# Patient Record
Sex: Male | Born: 1975 | Race: Black or African American | Hispanic: No | Marital: Single | Smoking: Never smoker
Health system: Southern US, Community
[De-identification: ages and names within clinical notes are randomized; demographics above are authoritative.]

## PROBLEM LIST (undated history)

## (undated) DIAGNOSIS — J45909 Unspecified asthma, uncomplicated: Secondary | ICD-10-CM

## (undated) DIAGNOSIS — M2010 Hallux valgus (acquired), unspecified foot: Secondary | ICD-10-CM

---

## 2011-10-14 ENCOUNTER — Encounter (HOSPITAL_COMMUNITY): Payer: Self-pay | Admitting: Emergency Medicine

## 2011-10-14 ENCOUNTER — Emergency Department (HOSPITAL_COMMUNITY)
Admission: EM | Admit: 2011-10-14 | Discharge: 2011-10-14 | Disposition: A | Discharge status: Home / Self Care | Payer: Self-pay | Attending: Emergency Medicine | Admitting: Emergency Medicine

## 2011-10-14 ENCOUNTER — Emergency Department (HOSPITAL_COMMUNITY): Payer: Self-pay

## 2011-10-14 DIAGNOSIS — M25539 Pain in unspecified wrist: Secondary | ICD-10-CM | POA: Insufficient documentation

## 2011-10-14 DIAGNOSIS — G8929 Other chronic pain: Secondary | ICD-10-CM | POA: Insufficient documentation

## 2011-10-14 MED ORDER — IBUPROFEN 800 MG PO TABS: 800.0000 mg | ORAL_TABLET | Freq: Three times a day (TID) | ORAL | Status: AC

## 2011-10-14 MED ORDER — IBUPROFEN 400 MG PO TABS
800.0000 mg | ORAL_TABLET | Freq: Once | ORAL | Status: AC
  Administered 2011-10-14: 800 mg via ORAL
  Filled 2011-10-14: qty 2

## 2011-10-14 NOTE — ED Notes (Signed)
Pt states his right wrist has been hurting for the last two days. States had a cyst in the same area that burst approx  1 month ago. Also c/o right arm numbness with he turns his head.

## 2011-10-14 NOTE — Progress Notes (Signed)
Orthopedic Tech Progress Note Patient Details:  Frederick Lyons [birth year] [id number]      Cammer, Mickie Bail 10/14/2011, 5:08 PM

## 2011-10-14 NOTE — ED Provider Notes (Signed)
History     CSN: [id number]  Arrival date & time 10/14/11  1242   None     Chief Complaint  Patient presents with  . Wrist Pain    (Consider location/radiation/quality/duration/timing/severity/associated sxs/prior treatment) Patient is a 36 y.o. male presenting with wrist pain. The history is provided by the patient. No language interpreter was used.  Wrist Pain This is a chronic problem. The current episode started 1 to 4 weeks ago. The problem occurs daily. The problem has been gradually worsening. Pertinent negatives include no fever, joint swelling, nausea, vomiting or weakness. The symptoms are aggravated by twisting and bending. He has tried nothing for the symptoms.   Pt c/o r wrist pain x > 4 weeks.  States that he had a cyst on his R wrist 2 months ago but hit it playing basketball and the cyst is gone now.  Denies injury.  +cms to wrist and hand.  No swelling or defomity.   History reviewed. No pertinent past medical history.  History reviewed. No pertinent past surgical history.  History reviewed. No pertinent family history.  History  Substance Use Topics  . Smoking status: Not on file  . Smokeless tobacco: Not on file  . Alcohol Use: Not on file      Review of Systems  Constitutional: Negative.  Negative for fever.  HENT: Negative.   Eyes: Negative.   Respiratory: Negative.   Cardiovascular: Negative.   Gastrointestinal: Negative.  Negative for nausea and vomiting.  Musculoskeletal: Negative for joint swelling.       R wrist pain.  Neurological: Negative.  Negative for weakness.  Psychiatric/Behavioral: Negative.   All other systems reviewed and are negative.    Allergies  Review of patient's allergies indicates no known allergies.  Home Medications  No current outpatient prescriptions on file.  BP 104/74  Pulse 64  Temp 98.1 F (36.7 C) (Oral)  Resp 18  Wt 180 lb (81.647 kg)  SpO2 97%  Physical Exam  Nursing note and vitals  reviewed. Constitutional: He is oriented to person, place, and time. He appears well-developed and well-nourished.  HENT:  Head: Normocephalic.  Eyes: Conjunctivae and EOM are normal. Pupils are equal, round, and reactive to light.  Neck: Normal range of motion. Neck supple.  Cardiovascular: Normal rate.   Pulmonary/Chest: Effort normal.  Abdominal: Soft.  Musculoskeletal: Normal range of motion. He exhibits tenderness. He exhibits no edema.       R wrist pain   Neurological: He is alert and oriented to person, place, and time.  Skin: Skin is warm and dry.  Psychiatric: He has a normal mood and affect.    ED Course  Procedures (including critical care time)  Labs Reviewed - No data to display No results found.   No diagnosis found.    MDM   Right wrist pain x2 months. No acute findings on x-ray. Ibuprofen ice and elevate. Followup with or throws not better.        Remi Haggard, NP 10/14/11 1720

## 2011-10-14 NOTE — ED Notes (Signed)
Had a cyst on rt wrist and it went  Away  X several months ago and now for the past 2 weeks it has hurt and when he turns his neck to the rt his arm goes numb that also has been going on x 2 months

## 2011-10-15 NOTE — ED Provider Notes (Signed)
Medical screening examination/treatment/procedure(s) were performed by non-physician practitioner and as supervising physician I was immediately available for consultation/collaboration.  Gretel Cantu T Aurelius Gildersleeve, MD 10/15/11 0711 

## 2013-05-23 ENCOUNTER — Encounter (HOSPITAL_COMMUNITY): Payer: Self-pay | Admitting: Emergency Medicine

## 2013-05-23 ENCOUNTER — Emergency Department (HOSPITAL_COMMUNITY): Payer: 59

## 2013-05-23 ENCOUNTER — Emergency Department (HOSPITAL_COMMUNITY)
Admission: EM | Admit: 2013-05-23 | Discharge: 2013-05-23 | Disposition: A | Discharge status: Home / Self Care | Payer: 59 | Attending: Emergency Medicine | Admitting: Emergency Medicine

## 2013-05-23 DIAGNOSIS — Y929 Unspecified place or not applicable: Secondary | ICD-10-CM | POA: Insufficient documentation

## 2013-05-23 DIAGNOSIS — M79641 Pain in right hand: Secondary | ICD-10-CM

## 2013-05-23 DIAGNOSIS — S62309A Unspecified fracture of unspecified metacarpal bone, initial encounter for closed fracture: Secondary | ICD-10-CM | POA: Insufficient documentation

## 2013-05-23 DIAGNOSIS — W2209XA Striking against other stationary object, initial encounter: Secondary | ICD-10-CM | POA: Insufficient documentation

## 2013-05-23 DIAGNOSIS — Y9389 Activity, other specified: Secondary | ICD-10-CM | POA: Insufficient documentation

## 2013-05-23 MED ORDER — LIDOCAINE HCL (PF) 1 % IJ SOLN
5.0000 mL | Freq: Once | INTRAMUSCULAR | Status: AC
  Administered 2013-05-23: 5 mL
  Filled 2013-05-23: qty 5

## 2013-05-23 MED ORDER — HYDROCODONE-ACETAMINOPHEN 5-325 MG PO TABS
2.0000 | ORAL_TABLET | Freq: Once | ORAL | Status: AC
  Administered 2013-05-23: 2 via ORAL
  Filled 2013-05-23: qty 2

## 2013-05-23 MED ORDER — HYDROCODONE-ACETAMINOPHEN 5-325 MG PO TABS: 1.0000 | ORAL_TABLET | Freq: Four times a day (QID) | ORAL | Status: DC | PRN
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
## 2013-05-23 NOTE — Progress Notes (Signed)
Orthopedic Tech Progress Note Patient Details:  Frederick PersonsBarry L Lyons 07/07/1975 [id number]  Ortho Devices Type of Ortho Device: Ulna gutter splint   [name], [name] 05/23/2013, 7:02 AM

## 2013-05-23 NOTE — Discharge Instructions (Signed)
You have a finger fracture of the left hand, and possible hand fracture on right side as well. Keep the splints prescribed. See the Orthopedic doctor as requested.   Cast or Splint Care Casts and splints support injured limbs and keep bones from moving while they heal. It is important to care for your cast or splint at home.  HOME CARE INSTRUCTIONS  Keep the cast or splint uncovered during the drying period. It can take 24 to 48 hours to dry if it is made of plaster. A fiberglass cast will dry in less than 1 hour.  Do not rest the cast on anything harder than a pillow for the first 24 hours.  Do not put weight on your injured limb or apply pressure to the cast until your health care provider gives you permission.  Keep the cast or splint dry. Wet casts or splints can lose their shape and may not support the limb as well. A wet cast that has lost its shape can also create harmful pressure on your skin when it dries. Also, wet skin can become infected.  Cover the cast or splint with a plastic bag when bathing or when out in the rain or snow. If the cast is on the trunk of the body, take sponge baths until the cast is removed.  If your cast does become wet, dry it with a towel or a blow dryer on the cool setting only.  Keep your cast or splint clean. Soiled casts may be wiped with a moistened cloth.  Do not place any hard or soft foreign objects under your cast or splint, such as cotton, toilet paper, lotion, or powder.  Do not try to scratch the skin under the cast with any object. The object could get stuck inside the cast. Also, scratching could lead to an infection. If itching is a problem, use a blow dryer on a cool setting to relieve discomfort.  Do not trim or cut your cast or remove padding from inside of it.  Exercise all joints next to the injury that are not immobilized by the cast or splint. For example, if you have a long leg cast, exercise the hip joint and toes. If you have  an arm cast or splint, exercise the shoulder, elbow, thumb, and fingers.  Elevate your injured arm or leg on 1 or 2 pillows for the first 1 to 3 days to decrease swelling and pain.It is best if you can comfortably elevate your cast so it is higher than your heart. SEEK MEDICAL CARE IF:   Your cast or splint cracks.  Your cast or splint is too tight or too loose.  You have unbearable itching inside the cast.  Your cast becomes wet or develops a soft spot or area.  You have a bad smell coming from inside your cast.  You get an object stuck under your cast.  Your skin around the cast becomes red or raw.  You have new pain or worsening pain after the cast has been applied. SEEK IMMEDIATE MEDICAL CARE IF:   You have fluid leaking through the cast.  You are unable to move your fingers or toes.  You have discolored (blue or white), cool, painful, or very swollen fingers or toes beyond the cast.  You have tingling or numbness around the injured area.  You have severe pain or pressure under the cast.  You have any difficulty with your breathing or have shortness of breath.  You have chest pain.  Document Released: 03/22/2000 Document Revised: 01/13/2013 Document Reviewed: 10/01/2012 [facility] Patient Information 2014 [place], [place].  Closed Reduction for Metacarpal Fracture or Dislocation Care After Refer to this sheet in the next few weeks. These instructions provide you with information on caring for yourself after your procedure. Your caregiver may also give you more specific instructions. Your treatment has been planned according to current medical practices, but problems sometimes occur. Call your caregiver if you have any problems or questions after your procedure. HOME CARE INSTRUCTIONS   Only take medicines as directed by your caregiver.  Keep the injured area elevated above the level of your heart while you are sleeping or sitting down.  Apply ice to the injured area,  twice per day, for 2 3 days:  Put ice in a plastic bag.  Place a towel between your skin and the bag.  Leave the ice on for 15 minutes.  Keep your splint clean and dry. Cover your splint with a bag while you shower. SEEK MEDICAL CARE IF:  Your pain does not get better or gets worse.  You develop numbness or tingling.  Your fingers turn white or bluish. MAKE SURE YOU:  Understand these instructions.  Will watch your condition.  Will get help right away if you are not doing well or get worse. Document Released: 12/18/2011 Document Revised: 07/20/2012 Document Reviewed: 12/18/2011 [facility] CenterExitCare Patient Information 2014 [place], [place].  Hand Fracture Your caregiver has diagnosed you with a fractured (broken) bone in your hand. If the bones are in good position and the hand is properly immobilized and rested, these injuries will usually heal in 3 to 6 weeks. A cast, splint, or bulky bandage is usually applied to keep the fracture site from moving. Do not remove the splint or cast until your caregiver approves. If the fracture is unstable or the bones are not aligned properly, surgery may be needed. Keep your hand raised (elevated) above the level of your heart as much as possible for the next 2 to 3 days until the swelling and pain are better. Apply ice packs for 15-20 minutes every 3 to 4 hours to help control the pain and swelling. See your caregiver or an orthopedic specialist as directed for follow-up care to make sure the fracture is beginning to heal properly. SEEK IMMEDIATE MEDICAL CARE IF:   You notice your fingers are cold, numb, crooked, or the pain of your injury is severe.  You are not improving or seem to be getting worse.  You have questions or concerns. Document Released: 05/02/2004 Document Revised: 06/17/2011 Document Reviewed: 09/20/2008 [facility]ExitCare Patient Information 2014 [place], [place].
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
## 2013-05-29 HISTORY — PX: OTHER SURGICAL HISTORY: SHX169

## 2013-06-18 ENCOUNTER — Encounter (HOSPITAL_BASED_OUTPATIENT_CLINIC_OR_DEPARTMENT_OTHER): Payer: Self-pay | Admitting: *Deleted

## 2013-06-18 NOTE — Progress Notes (Addendum)
NPO AFTER MN. ARRIVE AT 0745. NEEDS HG.  NOTED PT TIME CHANGE.  CALLED AND SPOKE TO PT MOTHER, WHOM IS BRINGING PT DOS, TO ARRIVE AT [phone] FOR SURGERY START AT 0800.

## 2013-06-24 ENCOUNTER — Ambulatory Visit (HOSPITAL_BASED_OUTPATIENT_CLINIC_OR_DEPARTMENT_OTHER)
Admission: RE | Admit: 2013-06-24 | Discharge: 2013-06-24 | Disposition: A | Discharge status: Home / Self Care | Payer: 59 | Source: Ambulatory Visit | Attending: Podiatry | Admitting: Podiatry

## 2013-06-24 ENCOUNTER — Encounter (HOSPITAL_BASED_OUTPATIENT_CLINIC_OR_DEPARTMENT_OTHER)
Admission: RE | Disposition: A | Discharge status: Home / Self Care | Payer: Self-pay | Source: Ambulatory Visit | Attending: Podiatry

## 2013-06-24 ENCOUNTER — Encounter (HOSPITAL_BASED_OUTPATIENT_CLINIC_OR_DEPARTMENT_OTHER): Payer: 59 | Admitting: Anesthesiology

## 2013-06-24 ENCOUNTER — Ambulatory Visit (HOSPITAL_BASED_OUTPATIENT_CLINIC_OR_DEPARTMENT_OTHER): Payer: 59 | Admitting: Anesthesiology

## 2013-06-24 ENCOUNTER — Encounter (HOSPITAL_BASED_OUTPATIENT_CLINIC_OR_DEPARTMENT_OTHER): Payer: Self-pay | Admitting: *Deleted

## 2013-06-24 DIAGNOSIS — M2010 Hallux valgus (acquired), unspecified foot: Secondary | ICD-10-CM | POA: Insufficient documentation

## 2013-06-24 DIAGNOSIS — M2060 Acquired deformities of toe(s), unspecified, unspecified foot: Secondary | ICD-10-CM | POA: Insufficient documentation

## 2013-06-24 HISTORY — DX: Hallux valgus (acquired), unspecified foot: M20.10

## 2013-06-24 HISTORY — PX: METATARSAL OSTEOTOMY: SHX1641

## 2013-06-24 HISTORY — PX: BUNIONECTOMY: SHX129

## 2013-06-24 LAB — POCT HEMOGLOBIN-HEMACUE: HEMOGLOBIN: 12.9 g/dL — AB (ref 13.0–17.0)

## 2013-06-24 SURGERY — BUNIONECTOMY
Anesthesia: Monitor Anesthesia Care | Site: Foot | Laterality: Right

## 2013-06-24 MED ORDER — FENTANYL CITRATE 0.05 MG/ML IJ SOLN
INTRAMUSCULAR | Status: AC
  Filled 2013-06-24: qty 2

## 2013-06-24 MED ORDER — MIDAZOLAM HCL 2 MG/2ML IJ SOLN
INTRAMUSCULAR | Status: AC
  Filled 2013-06-24: qty 2

## 2013-06-24 MED ORDER — FENTANYL CITRATE 0.05 MG/ML IJ SOLN
INTRAMUSCULAR | Status: AC
  Filled 2013-06-24: qty 4

## 2013-06-24 MED ORDER — ONDANSETRON HCL 4 MG/2ML IJ SOLN
INTRAMUSCULAR | Status: DC | PRN
  Administered 2013-06-24: 4 mg via INTRAVENOUS

## 2013-06-24 MED ORDER — MIDAZOLAM HCL 2 MG/2ML IJ SOLN
2.0000 mg | Freq: Once | INTRAMUSCULAR | Status: AC
  Administered 2013-06-24: 2 mg via INTRAVENOUS
  Filled 2013-06-24: qty 2

## 2013-06-24 MED ORDER — LACTATED RINGERS IV SOLN
INTRAVENOUS | Status: DC
Start: 2013-06-24 — End: 2013-06-24
  Administered 2013-06-24: 08:00:00 via INTRAVENOUS
  Filled 2013-06-24: qty 1000

## 2013-06-24 MED ORDER — PROPOFOL 10 MG/ML IV BOLUS
INTRAVENOUS | Status: DC | PRN
  Administered 2013-06-24: 180 mg via INTRAVENOUS

## 2013-06-24 MED ORDER — FENTANYL CITRATE 0.05 MG/ML IJ SOLN
25.0000 ug | INTRAMUSCULAR | Status: DC | PRN
  Filled 2013-06-24: qty 1

## 2013-06-24 MED ORDER — SODIUM CHLORIDE 0.9 % IJ SOLN
3.0000 mL | INTRAMUSCULAR | Status: DC | PRN
  Filled 2013-06-24: qty 3

## 2013-06-24 MED ORDER — OXYCODONE-ACETAMINOPHEN 5-325 MG PO TABS
1.0000 | ORAL_TABLET | ORAL | Status: DC | PRN
  Filled 2013-06-24: qty 2

## 2013-06-24 MED ORDER — FENTANYL CITRATE 0.05 MG/ML IJ SOLN
100.0000 ug | Freq: Once | INTRAMUSCULAR | Status: AC
  Administered 2013-06-24: 100 ug via INTRAVENOUS
  Filled 2013-06-24: qty 2

## 2013-06-24 MED ORDER — ROPIVACAINE HCL 5 MG/ML IJ SOLN
INTRAMUSCULAR | Status: DC | PRN
  Administered 2013-06-24: 30 mL via PERINEURAL

## 2013-06-24 MED ORDER — SODIUM CHLORIDE 0.9 % IJ SOLN
3.0000 mL | Freq: Two times a day (BID) | INTRAMUSCULAR | Status: DC
  Filled 2013-06-24: qty 3

## 2013-06-24 MED ORDER — SODIUM CHLORIDE 0.9 % IV SOLN
250.0000 mL | INTRAVENOUS | Status: DC | PRN
  Filled 2013-06-24: qty 250

## 2013-06-24 MED ORDER — BUPIVACAINE LIPOSOME 1.3 % IJ SUSP
INTRAMUSCULAR | Status: DC | PRN
  Administered 2013-06-24: 20 mL

## 2013-06-24 MED ORDER — PROMETHAZINE HCL 25 MG/ML IJ SOLN
6.2500 mg | INTRAMUSCULAR | Status: DC | PRN
  Filled 2013-06-24: qty 1

## 2013-06-24 MED ORDER — FENTANYL CITRATE 0.05 MG/ML IJ SOLN
INTRAMUSCULAR | Status: DC | PRN
  Administered 2013-06-24: 50 ug via INTRAVENOUS

## 2013-06-24 MED ORDER — ACETAMINOPHEN 325 MG PO TABS
650.0000 mg | ORAL_TABLET | ORAL | Status: DC | PRN
  Filled 2013-06-24: qty 2

## 2013-06-24 MED ORDER — LACTATED RINGERS IV SOLN
INTRAVENOUS | Status: DC
  Filled 2013-06-24: qty 1000

## 2013-06-24 MED ORDER — CEFAZOLIN SODIUM-DEXTROSE 2-3 GM-% IV SOLR
2.0000 g | INTRAVENOUS | Status: AC
  Administered 2013-06-24: 2 g via INTRAVENOUS
  Filled 2013-06-24: qty 50

## 2013-06-24 MED ORDER — PROPOFOL 10 MG/ML IV EMUL
INTRAVENOUS | Status: DC | PRN
  Administered 2013-06-24: 50 ug/kg/min via INTRAVENOUS

## 2013-06-24 MED ORDER — LIDOCAINE HCL (CARDIAC) 20 MG/ML IV SOLN
INTRAVENOUS | Status: DC | PRN
  Administered 2013-06-24: 50 mg via INTRAVENOUS

## 2013-06-24 MED ORDER — MIDAZOLAM HCL 5 MG/5ML IJ SOLN
INTRAMUSCULAR | Status: DC | PRN
  Administered 2013-06-24: 2 mg via INTRAVENOUS

## 2013-06-24 MED ORDER — DEXAMETHASONE SODIUM PHOSPHATE 4 MG/ML IJ SOLN
INTRAMUSCULAR | Status: DC | PRN
  Administered 2013-06-24: 10 mg via INTRAVENOUS

## 2013-06-24 MED ORDER — SODIUM CHLORIDE 0.9 % IR SOLN
Status: DC | PRN
  Administered 2013-06-24: 09:00:00

## 2013-06-24 MED ORDER — MEPERIDINE HCL 25 MG/ML IJ SOLN
6.2500 mg | INTRAMUSCULAR | Status: DC | PRN
  Filled 2013-06-24: qty 1

## 2013-06-24 SURGICAL SUPPLY — 95 items
BANDAGE CO FLEX L/F 2IN X 5YD (GAUZE/BANDAGES/DRESSINGS) IMPLANT
BANDAGE COBAN STERILE 2 (GAUZE/BANDAGES/DRESSINGS) ×3 IMPLANT
BANDAGE ELASTIC 6 VELCRO ST LF (GAUZE/BANDAGES/DRESSINGS) ×3 IMPLANT
BENZOIN TINCTURE PRP APPL 2/3 (GAUZE/BANDAGES/DRESSINGS) ×3 IMPLANT
BIT DRILL 2 (BIT) ×3 IMPLANT
BLADE AVERAGE 25MMX9MM (BLADE) ×1
BLADE AVERAGE 25X9 (BLADE) ×2 IMPLANT
BLADE FLAT COURSE (BLADE) IMPLANT
BLADE MICRO SAGITTAL (BLADE) ×3 IMPLANT
BLADE OSC/SAG .038X5.5 CUT EDG (BLADE) IMPLANT
BLADE SAW SAGI 13.0X70X1.19 (BLADE) IMPLANT
BLADE SAW SAGI 13.0X70X1.19MM (BLADE)
BLADE SURG 15 STRL LF DISP TIS (BLADE) ×8 IMPLANT
BLADE SURG 15 STRL SS (BLADE) ×16
BNDG CONFORM 2 STRL LF (GAUZE/BANDAGES/DRESSINGS) ×3 IMPLANT
BNDG ESMARK 4X9 LF (GAUZE/BANDAGES/DRESSINGS) ×3 IMPLANT
BUR EGG 3PK/BX (BURR) IMPLANT
BUR SIDE CUT 44.8 STRL (BURR) IMPLANT
BURR SIDE CUT 44.8 STRL (BURR)
BURR SIDE CUT 44.8MML STRL (BURR)
CLIP EASY 18MMX17MM (Staple) ×3 IMPLANT
CLIP EZ 15X15X15 (Clip) ×3 IMPLANT
CLOSURE WOUND 1/4X4 (GAUZE/BANDAGES/DRESSINGS) ×1
CLOTH BEACON ORANGE TIMEOUT ST (SAFETY) ×3 IMPLANT
COVER MAYO STAND STRL (DRAPES) ×3 IMPLANT
COVER TABLE BACK 60X90 (DRAPES) ×3 IMPLANT
CUFF TOURN SGL QUICK 18 (TOURNIQUET CUFF) ×3 IMPLANT
DRAPE EXTREMITY T 121X128X90 (DRAPE) ×3 IMPLANT
DRAPE OEC MINIVIEW 54X84 (DRAPES) ×3 IMPLANT
DRSG EMULSION OIL 3X3 NADH (GAUZE/BANDAGES/DRESSINGS) IMPLANT
DURAPREP 26ML APPLICATOR (WOUND CARE) ×3 IMPLANT
ELECT REM PT RETURN 9FT ADLT (ELECTROSURGICAL) ×3
ELECTRODE REM PT RTRN 9FT ADLT (ELECTROSURGICAL) ×1 IMPLANT
GAUZE SPONGE 4X4 12PLY STRL LF (GAUZE/BANDAGES/DRESSINGS) IMPLANT
GAUZE SPONGE 4X4 16PLY XRAY LF (GAUZE/BANDAGES/DRESSINGS) ×3 IMPLANT
GAUZE XEROFORM 1X8 LF (GAUZE/BANDAGES/DRESSINGS) ×3 IMPLANT
GLOVE BIO SURGEON STRL SZ7.5 (GLOVE) ×3 IMPLANT
GLOVE BIOGEL M 6.5 STRL (GLOVE) ×3 IMPLANT
GLOVE BIOGEL M STER SZ 6 (GLOVE) ×6 IMPLANT
GLOVE BIOGEL PI IND STRL 6.5 (GLOVE) ×3 IMPLANT
GLOVE BIOGEL PI IND STRL 7.5 (GLOVE) ×2 IMPLANT
GLOVE BIOGEL PI INDICATOR 6.5 (GLOVE) ×6
GLOVE BIOGEL PI INDICATOR 7.5 (GLOVE) ×4
GLOVE INDICATOR 7.5 STRL GRN (GLOVE) ×3 IMPLANT
GLOVE SURG SIGNA 7.5 PF LTX (GLOVE) ×3 IMPLANT
GOWN PREVENTION PLUS LG XLONG (DISPOSABLE) IMPLANT
GOWN PREVENTION PLUS XLARGE (GOWN DISPOSABLE) IMPLANT
GOWN STRL REUS W/TWL LRG LVL3 (GOWN DISPOSABLE) ×9 IMPLANT
GOWN STRL REUS W/TWL XL LVL3 (GOWN DISPOSABLE) ×6 IMPLANT
GUIDEWIRE ORTH 6X062XTROC NS (WIRE) ×1 IMPLANT
K-WIRE .062 (WIRE) ×2
KWIRE 4.0 X .045IN (WIRE) IMPLANT
LOOP VESSEL MAXI BLUE (MISCELLANEOUS) ×3 IMPLANT
NDL SAFETY ECLIPSE 18X1.5 (NEEDLE) ×1 IMPLANT
NEEDLE 27GAX1X1/2 (NEEDLE) ×3 IMPLANT
NEEDLE HYPO 18GX1.5 SHARP (NEEDLE) ×2
NEEDLE HYPO 22GX1.5 SAFETY (NEEDLE) ×3 IMPLANT
NS IRRIG 500ML POUR BTL (IV SOLUTION) IMPLANT
PACK BASIN DAY SURGERY FS (CUSTOM PROCEDURE TRAY) ×3 IMPLANT
PAD CAST 4YDX4 CTTN HI CHSV (CAST SUPPLIES) ×1 IMPLANT
PADDING CAST ABS 4INX4YD NS (CAST SUPPLIES) ×2
PADDING CAST ABS COTTON 4X4 ST (CAST SUPPLIES) ×1 IMPLANT
PADDING CAST COTTON 4X4 STRL (CAST SUPPLIES) ×2
PADDING CAST SYNTHETIC 2 (CAST SUPPLIES)
PADDING CAST SYNTHETIC 2X4 NS (CAST SUPPLIES) IMPLANT
PADDING CAST SYNTHETIC 3 NS LF (CAST SUPPLIES)
PADDING CAST SYNTHETIC 3X4 NS (CAST SUPPLIES) IMPLANT
PENCIL BUTTON HOLSTER BLD 10FT (ELECTRODE) ×3 IMPLANT
RASP SM TEAR CROSS CUT (RASP) ×3 IMPLANT
SCOTCHCAST PLUS 5X4 WHITE (CAST SUPPLIES) IMPLANT
SPONGE GAUZE 4X4 12PLY (GAUZE/BANDAGES/DRESSINGS) ×6 IMPLANT
SPONGE GAUZE 4X4 12PLY STER LF (GAUZE/BANDAGES/DRESSINGS) ×3 IMPLANT
SPONGE LAP 4X18 X RAY DECT (DISPOSABLE) ×3 IMPLANT
STAPLE FIXATION 8X8X8 (Staple) ×3 IMPLANT
STOCKINETTE 6  STRL (DRAPES) ×2
STOCKINETTE 6 STRL (DRAPES) ×1 IMPLANT
STOCKINETTE SYNTHETIC 4 NONSTR (MISCELLANEOUS) ×3 IMPLANT
STRIP CLOSURE SKIN 1/4X4 (GAUZE/BANDAGES/DRESSINGS) ×2 IMPLANT
STRYKER ×3 IMPLANT
SUCTION FRAZIER TIP 10 FR DISP (SUCTIONS) ×3 IMPLANT
SUT ETHILON 4 0 PS 2 18 (SUTURE) ×3 IMPLANT
SUT ETHILON 5 0 PS 2 18 (SUTURE) IMPLANT
SUT MNCRL AB 4-0 PS2 18 (SUTURE) ×3 IMPLANT
SUT VIC AB 3-0 FS2 27 (SUTURE) ×3 IMPLANT
SUT VIC AB 3-0 SH 27 (SUTURE) ×2
SUT VIC AB 3-0 SH 27X BRD (SUTURE) ×1 IMPLANT
SUT VICRYL 4-0 PS2 18IN ABS (SUTURE) ×6 IMPLANT
SYR 3ML 18GX1 1/2 (SYRINGE) ×3 IMPLANT
SYR BULB 3OZ (MISCELLANEOUS) ×3 IMPLANT
SYR CONTROL 10ML LL (SYRINGE) ×3 IMPLANT
TOWEL OR 17X24 6PK STRL BLUE (TOWEL DISPOSABLE) ×12 IMPLANT
TUBE CONNECTING 12'X1/4 (SUCTIONS) ×1
TUBE CONNECTING 12X1/4 (SUCTIONS) ×2 IMPLANT
UNDERPAD 30X30 INCONTINENT (UNDERPADS AND DIAPERS) ×3 IMPLANT
WATER STERILE IRR 500ML POUR (IV SOLUTION) ×3 IMPLANT

## 2013-06-24 NOTE — Anesthesia Preprocedure Evaluation (Addendum)
Anesthesia Evaluation  Patient identified by MRN, date of birth, ID band Patient awake    Reviewed: Allergy & Precautions, H&P , NPO status , Patient's Chart, lab work & pertinent test results  Airway Mallampati: II TM Distance: >3 FB Neck ROM: full    Dental no notable dental hx.    Pulmonary neg pulmonary ROS,  breath sounds clear to auscultation  Pulmonary exam normal       Cardiovascular Exercise Tolerance: Good negative cardio ROS  Rhythm:regular Rate:Normal     Neuro/Psych negative neurological ROS  negative psych ROS   GI/Hepatic negative GI ROS, Neg liver ROS,   Endo/Other  negative endocrine ROS  Renal/GU negative Renal ROS  negative genitourinary   Musculoskeletal   Abdominal   Peds  Hematology negative hematology ROS (+)   Anesthesia Other Findings   Reproductive/Obstetrics negative OB ROS                           Anesthesia Physical Anesthesia Plan  ASA: I  Anesthesia Plan: MAC   Post-op Pain Management: MAC Combined w/ Regional for Post-op pain   Induction:   Airway Management Planned:   Additional Equipment:   Intra-op Plan:   Post-operative Plan:   Informed Consent: I have reviewed the patients History and Physical, chart, labs and discussed the procedure including the risks, benefits and alternatives for the proposed anesthesia with the patient or authorized representative who has indicated his/her understanding and acceptance.   Dental Advisory Given  Plan Discussed with: CRNA  Anesthesia Plan Comments: (Popliteal block )        Anesthesia Quick Evaluation

## 2013-06-24 NOTE — Anesthesia Procedure Notes (Addendum)
Anesthesia Regional Block:  Popliteal block  Pre-Anesthetic Checklist: ,, timeout performed, Correct Patient, Correct Site, Correct Laterality, Correct Procedure, Correct Position, site marked, Risks and benefits discussed,  Surgical consent,  Pre-op evaluation,  At surgeon's request and post-op pain management  Laterality: Right and Lower  Prep: chloraprep       Needles:  Injection technique: Single-shot  Needle Type: Stimiplex     Needle Length: 10cm 10 cm Needle Gauge: 21 and 21 G    Additional Needles:  Procedures: ultrasound guided (picture in chart) and nerve stimulator Popliteal block Narrative:  Injection made incrementally with aspirations every 5 mL.  Performed by: Personally  Anesthesiologist: Phillips GroutPeter Carignan MD  Additional Notes: Patient tolerated the procedure well without complications   Procedure Name: LMA Insertion Date/Time: 06/24/2013 8:34 AM Performed by: Maris BergerENENNY, Laylia Mui T Pre-anesthesia Checklist: Patient identified, Emergency Drugs available, Suction available and Patient being monitored Patient Re-evaluated:Patient Re-evaluated prior to inductionOxygen Delivery Method: Circle System Utilized Preoxygenation: Pre-oxygenation with 100% oxygen Intubation Type: IV induction Ventilation: Mask ventilation without difficulty LMA: LMA inserted LMA Size: 5.0 Number of attempts: 1 Airway Equipment and Method: bite block Placement Confirmation: positive ETCO2 Dental Injury: Teeth and Oropharynx as per pre-operative assessment

## 2013-06-24 NOTE — Discharge Instructions (Signed)
°  Post Anesthesia Home Care Instructions ° °Activity: °Get plenty of rest for the remainder of the day. A responsible adult should stay with you for 24 hours following the procedure.  °For the next 24 hours, DO NOT: °-Drive a car °-Operate machinery °-Drink alcoholic beverages °-Take any medication unless instructed by your physician °-Make any legal decisions or sign important papers. ° °Meals: °Start with liquid foods such as gelatin or soup. Progress to regular foods as tolerated. Avoid greasy, spicy, heavy foods. If nausea and/or vomiting occur, drink only clear liquids until the nausea and/or vomiting subsides. Call your physician if vomiting continues. ° °Special Instructions/Symptoms: °Your throat may feel dry or sore from the anesthesia or the breathing tube placed in your throat during surgery. If this causes discomfort, gargle with warm salt water. The discomfort should disappear within 24 hours. ° ° °Regional Anesthesia Blocks ° °1. Numbness or the inability to move the "blocked" extremity may last from 3-48 hours after placement. The length of time depends on the medication injected and your individual response to the medication. If the numbness is not going away after 48 hours, call your surgeon. ° °2. The extremity that is blocked will need to be protected until the numbness is gone and the  Strength has returned. Because you cannot feel it, you will need to take extra care to avoid injury. Because it may be weak, you may have difficulty moving it or using it. You may not know what position it is in without looking at it while the block is in effect. ° °3. For blocks in the legs and feet, returning to weight bearing and walking needs to be done carefully. You will need to wait until the numbness is entirely gone and the strength has returned. You should be able to move your leg and foot normally before you try and bear weight or walk. You will need someone to be with you when you first try to ensure  you do not fall and possibly risk injury. ° °4. Bruising and tenderness at the needle site are common side effects and will resolve in a few days. ° °5. Persistent numbness or new problems with movement should be communicated to the surgeon or the [facility] ([phone])/ [facility] ([phone]). ° ° °Call your surgeon if you experience:  ° °1.  Fever over 101.0. °2.  Inability to urinate. °3.  Nausea and/or vomiting. °4.  Extreme swelling or bruising at the surgical site. °5.  Continued bleeding from the incision. °6.  Increased pain, redness or drainage from the incision. °7.  Problems related to your pain medication. °

## 2013-06-24 NOTE — Transfer of Care (Signed)
Immediate Anesthesia Transfer of Care Note  Patient: Frederick PersonsBarry L Lyons  Procedure(s) Performed: Procedure(s): LAPIDUS BUNION (Right) AKIN-OSTEOTOMY (Right)  Patient Location: PACU  Anesthesia Type:General  Level of Consciousness: awake and oriented  Airway & Oxygen Therapy: Patient Spontanous Breathing and Patient connected to face mask oxygen  Post-op Assessment: Report given to PACU RN  Post vital signs: Reviewed and stable  Complications: No apparent anesthesia complications

## 2013-06-25 NOTE — Anesthesia Postprocedure Evaluation (Signed)
  Anesthesia Post-op Note  Patient: Frederick Lyons  Procedure(s) Performed: Procedure(s) (LRB): LAPIDUS BUNION (Right) AKIN-OSTEOTOMY (Right)  Patient Location: PACU  Anesthesia Type: GA combined with regional for post-op pain  Level of Consciousness: awake and alert   Airway and Oxygen Therapy: Patient Spontanous Breathing  Post-op Pain: mild  Post-op Assessment: Post-op Vital signs reviewed, Patient's Cardiovascular Status Stable, Respiratory Function Stable, Patent Airway and No signs of Nausea or vomiting  Last Vitals:  Filed Vitals:   06/24/13 1310  BP: 143/77  Pulse: 85  Temp: 36.7 C  Resp: 18    Post-op Vital Signs: stable   Complications: No apparent anesthesia complications

## 2013-06-28 ENCOUNTER — Encounter (HOSPITAL_BASED_OUTPATIENT_CLINIC_OR_DEPARTMENT_OTHER): Payer: Self-pay | Admitting: Podiatry

## 2013-07-20 NOTE — Op Note (Signed)
NAMValentino Nose:  Frederick Lyons, Frederick Lyons             ACCOUNT NO.:  [id number]  MEDICAL RECORD NO.:  [id number]  LOCATION:                                 FACILITY:  PHYSICIAN:  Ezequiel KayserMartha J. Amore Grater, D.P.M.DATE OF BIRTH:  1975-07-26  DATE OF PROCEDURE:  06/24/2013 DATE OF DISCHARGE:                              OPERATIVE REPORT   SURGEON:  Ezequiel KayserMartha J. Jazmen Lindenbaum, D.P.M.  ASSISTANT:  N'Tuma Jah, DPM.  PREOPERATIVE DIAGNOSES: 1. Hallux abductovalgus deformity, right foot. 2. Hallux deformity, right foot.  POSTOPERATIVE DIAGNOSES: 1. Hallux abductovalgus deformity, right foot. 2. Hallux deformity, right foot.  PROCEDURE: 1. Akin osteotomy of the proximal phalanx. 2. Bunionectomy or bunion correction with Lapidus, first metatarsal,     first cuneiform fusion.  Double procedure for bunion correction.  ANESTHESIA:  General.  ESTIMATED BLOOD LOSS:  Minimal.  COMPLICATIONS:  None.  DESCRIPTION OF PROCEDURE:  The patient was brought to the OR and placed in the supine position, at which time general anesthesia was administered.  A local block was performed.  A well-padded pneumatic ankle tourniquet was applied superior to the medial malleolus.  The patient was prepped and draped in the usual aseptic manner.  The foot was exsanguinated with an Esmarch bandage and the previously applied tourniquet inflated to 250 mmHg.  Attention was directed to the first ray, where a dorsal linear incision was made.  The incision was deepened using sharp and blunt modalities, taking care to clamp and cauterize all bleeding vessels and gentle traction of neurovascular structures performed.  The deep and superficial fascia were separated medially and dorsally at the length of the incision.  Attention was directed to the first metatarsal head region where an inverted-L capsulotomy was performed.  The capsule and periosteum were freed from the first metatarsal.  The medial eminence was then resected parallel with the shaft  and all rough edges smoothed with a power rasp.  Attention was then directed to the first interspace where dissection was carried to the level of the fibular sesamoid.  Once identified, the fibular sesamoid was resected and carefully excised without disrupting the flexor tendon.  The integrity of the flexor apparatus was found to be intact.  The surgical wound was irrigated with copious amounts of sterile saline and antibiotic solution.  Attention was redirected to the level of the tarsometatarsal joint and incision was extended approximately 2 cm proximal.  Incision was deepened through the fascial layers through the dorsal capsule and tarsometatarsal joint using sharp and blunt dissection.  The vein and tendon retracted laterally.  The location of the joint was identified and a capsulotomy was performed to allow exposure of the joint.  Care was taken to protect all neurovascular structures encountered.  Care was also taken to ensure complete exposure of the lateral aspect of these joints.  Once identified, preparation was performed and a power saw was used to remove the cartilage of the base of the first metatarsal and distal aspect of the medial cuneiform.  Osteotome and mallet were also used to carefully remove the bone and cartilage.  Both the base of the first metatarsal and distal side of the medial cuneiform were fenestrated using a 0.045 smooth K-wire.  The area was irrigated with copious amounts of sterile saline and antibiotic solution.  With the use of intraoperative radiograph, the reduction of the intermetatarsal angle was accomplished and a temporary 0.062 smooth K-wire was used to hold the reduction.  Next, an internal nitinol staple was used once satisfactory reduction of the intermetatarsal angle was accomplished. Two staples were used, one dorsally and one medially.  The appropriate size was premeasured and these were found to be excellent fixation. Confirmation of  placement of the staples were confirmed using intraoperative radiograph.  The area was irrigated with copious amount of sterile saline and antibiotic solution.  Deep closure in this area was accomplished using 3-0 and 4-0 Vicryl.  Attention was directed at the hallux where the incision was extended to the level of the proximal phalanx and the periosteum, the capsule was reflected.  Once exposed, Akin osteotomy was performed taking care to leave the lateral cortex intact.  The wedge of bone was removed and the area was reduced and fixated using an 8 mm nitinol staple.  Again, placement of staple was confirmed with intraoperative radiograph.  Area was irrigated with copious amount of sterile saline and antibiotic solution.  Deep closure was accomplished using 3-0 and 4-0 Vicryl and then skin closure was accomplished for the entire incision using 4-0 Monocryl.  Once the tourniquet had been inflated after 1 hour and 59 minutes, it was deflated and 20 minutes was passed before reinflating the tourniquet.  The foot was dressed with Steri-Strips.  Exparel local anesthetic was administered.  Foot was dressed with Xeroform, 4x4s, Kling, and Coban.  The tourniquet was deflated and vascular status returned to all digits.  The patient was sent to the recovery room with vital signs stable and capillary refill time to presurgical levels. Both written and oral postop instructions were given to the patient.  He was instructed to remain completely nonweightbearing.  No guarantees were given.  All questions were answered.  The patient was instructed to contact the office immediately if there are any problems or issues with postoperative care.          ______________________________ Ezequiel KayserMartha J. Harriet PhoAjlouny, D.P.M.     MJA/MEDQ  D:  07/19/2013  T:  07/20/2013  Job:  [id number]

## 2014-03-08 ENCOUNTER — Encounter (HOSPITAL_BASED_OUTPATIENT_CLINIC_OR_DEPARTMENT_OTHER): Payer: Self-pay | Admitting: Podiatry

## 2015-08-23 ENCOUNTER — Encounter (HOSPITAL_COMMUNITY): Payer: Self-pay

## 2015-08-23 ENCOUNTER — Emergency Department (HOSPITAL_COMMUNITY)
Admission: EM | Admit: 2015-08-23 | Discharge: 2015-08-23 | Disposition: A | Discharge status: Home / Self Care | Payer: Self-pay | Attending: Emergency Medicine | Admitting: Emergency Medicine

## 2015-08-23 DIAGNOSIS — M25511 Pain in right shoulder: Secondary | ICD-10-CM | POA: Insufficient documentation

## 2015-08-23 DIAGNOSIS — Z791 Long term (current) use of non-steroidal anti-inflammatories (NSAID): Secondary | ICD-10-CM | POA: Insufficient documentation

## 2015-08-23 MED ORDER — NAPROXEN 500 MG PO TABS: 500.0000 mg | ORAL_TABLET | Freq: Two times a day (BID) | ORAL | Status: DC

## 2015-08-23 NOTE — Discharge Instructions (Signed)
Take your medications as prescribed. Follow-up with your doctor in the next week for reevaluation. Return to ED for new or worsening symptoms.  Joint Pain Joint pain, which is also called arthralgia, can be caused by many things. Joint pain often goes away when you follow your health care provider's instructions for relieving pain at home. However, joint pain can also be caused by conditions that require further treatment. Common causes of joint pain include:  Bruising in the area of the joint.  Overuse of the joint.  Wear and tear on the joints that occur with aging (osteoarthritis).  Various other forms of arthritis.  A buildup of a crystal form of uric acid in the joint (gout).  Infections of the joint (septic arthritis) or of the bone (osteomyelitis). Your health care provider may recommend medicine to help with the pain. If your joint pain continues, additional tests may be needed to diagnose your condition. HOME CARE INSTRUCTIONS Watch your condition for any changes. Follow these instructions as directed to lessen the pain that you are feeling.  Take medicines only as directed by your health care provider.  Rest the affected area for as long as your health care provider says that you should. If directed to do so, raise the painful joint above the level of your heart while you are sitting or lying down.  Do not do things that cause or worsen pain.  If directed, apply ice to the painful area:  Put ice in a plastic bag.  Place a towel between your skin and the bag.  Leave the ice on for 20 minutes, 2-3 times per day.  Wear an elastic bandage, splint, or sling as directed by your health care provider. Loosen the elastic bandage or splint if your fingers or toes become numb and tingle, or if they turn cold and blue.  Begin exercising or stretching the affected area as directed by your health care provider. Ask your health care provider what types of exercise are safe for  you.  Keep all follow-up visits as directed by your health care provider. This is important. SEEK MEDICAL CARE IF:  Your pain increases, and medicine does not help.  Your joint pain does not improve within 3 days.  You have increased bruising or swelling.  You have a fever.  You lose 10 lb (4.5 kg) or more without trying. SEEK IMMEDIATE MEDICAL CARE IF:  You are not able to move the joint.  Your fingers or toes become numb or they turn cold and blue.   This information is not intended to replace advice given to you by your health care provider. Make sure you discuss any questions you have with your health care provider.   Document Released: 03/25/2005 Document Revised: 04/15/2014 Document Reviewed: 01/04/2014 Elsevier Interactive Patient Education [employer]2016 [employer]vier Inc.

## 2015-08-23 NOTE — ED Provider Notes (Signed)
CSN: [id number]     Arrival date & time 08/23/15  0750 History   First MD Initiated Contact with Patient 08/23/15 0809     Chief Complaint  Patient presents with  . Shoulder Pain     (Consider location/radiation/quality/duration/timing/severity/associated sxs/prior Treatment) HPI Frederick Lyons is a 40 y.o. male who comes in for evaluation of right-sided shoulder pain. Patient reports atraumatic shoulder pain over the past 2 weeks. He characterizes this sensation as an ache that goes down his shoulder in the back of his arm. He has intermittently tried Motrin without relief. Symptoms are worse at night when sleeping on shoulder. Denies any other excessive shoulder use, exercise. No fevers, chills, numbness or weakness, Chest pain, shortness of breath, abdominal pain. Symptoms are rated as moderate.  Past Medical History  Diagnosis Date  . Hallux valgus (acquired)     RIGHT   Past Surgical History  Procedure Laterality Date  . Orif  left 5th metacarpal fx  05-29-2013  . Bunionectomy Right 06/24/2013    Procedure: LAPIDUS BUNION;  Surgeon: Larey DresserMartha Ajlouny, DPM;  [facility] [place];  Service: Podiatry;  Laterality: Right;  . Metatarsal osteotomy Right 06/24/2013    Procedure: AKIN-OSTEOTOMY;  Surgeon: Larey DresserMartha Ajlouny, DPM;  [facility] [place];  Service: Podiatry;  Laterality: Right;   No family history on file. Social History  Substance Use Topics  . Smoking status: Never Smoker   . Smokeless tobacco: Never Used  . Alcohol Use: Yes     Comment: OCCASIONAL    Review of Systems A 10 point review of systems was completed and was negative except for pertinent positives and negatives as mentioned in the history of present illness     Allergies  Review of patient's allergies indicates no known allergies.  Home Medications   Prior to Admission medications   Medication Sig Start Date End Date Taking? Authorizing Provider  ibuprofen (ADVIL,MOTRIN)  200 MG tablet Take 400 mg by mouth every 6 (six) hours as needed for fever or moderate pain.   Yes Historical Provider, MD  naproxen (NAPROSYN) 500 MG tablet Take 1 tablet (500 mg total) by mouth 2 (two) times daily. 08/23/15   Joycie PeekBenjamin Maleeya Peterkin, PA-C   BP 116/80 mmHg  Pulse 87  Temp(Src) 98.7 F (37.1 C) (Oral)  Resp 16  SpO2 99% Physical Exam  Constitutional:  Awake, alert, nontoxic appearance.  HENT:  Head: Atraumatic.  Eyes: Right eye exhibits no discharge. Left eye exhibits no discharge.  Neck: Neck supple.  Pulmonary/Chest: Effort normal. He exhibits no tenderness.  Abdominal: Soft. There is no tenderness. There is no rebound.  Musculoskeletal: He exhibits no tenderness.  Baseline ROM, no obvious new focal weakness. Discomfort is replicated with external rotation of right shoulder. No erythema, edema, crepitus.  Neurological:  Mental status and motor strength appears baseline for patient and situation. Sensation intact to light touch. Moves extremities without ataxia  Skin: No rash noted.  Psychiatric: He has a normal mood and affect.  Nursing note and vitals reviewed.   ED Course  Procedures (including critical care time) Labs Review Labs Reviewed - No data to display  Imaging Review No results found. I have personally reviewed and evaluated these images and lab results as part of my medical decision-making.   EKG Interpretation None      MDM  Frederick PersonsBarry L Cala is a 40 y.o. male presents for atraumatic right shoulder pain. Physical exam consistent with tendinitis versus other MSK pathology and no evidence  of hemarthrosis, septic joint. Remains neurovascularly intact. Encouraged NSAID trial and follow up with PCP. Return precautions discussed. Final diagnoses:  Right shoulder pain      Joycie Peek, PA-C 08/23/15 4098  Leta Baptist, MD 09/03/15 [phone]

## 2015-08-23 NOTE — ED Notes (Signed)
He c/o non-traumatic "real bad aching" of right shoulder which radiates into right forearm and wrist x 2 weeks.  He is in no distress.

## 2015-08-23 NOTE — ED Notes (Signed)
Discharge instructions, follow up care, and rx x1 reviewed with patient. Patient verbalized understanding. 

## 2015-08-23 NOTE — ED Notes (Signed)
PA at bedside.

## 2015-09-04 ENCOUNTER — Emergency Department (HOSPITAL_COMMUNITY)
Admission: EM | Admit: 2015-09-04 | Discharge: 2015-09-05 | Disposition: A | Discharge status: Home / Self Care | Payer: Self-pay | Attending: Emergency Medicine | Admitting: Emergency Medicine

## 2015-09-04 ENCOUNTER — Encounter (HOSPITAL_COMMUNITY): Payer: Self-pay | Admitting: *Deleted

## 2015-09-04 DIAGNOSIS — Z791 Long term (current) use of non-steroidal anti-inflammatories (NSAID): Secondary | ICD-10-CM | POA: Insufficient documentation

## 2015-09-04 DIAGNOSIS — J069 Acute upper respiratory infection, unspecified: Secondary | ICD-10-CM | POA: Insufficient documentation

## 2015-09-04 LAB — RAPID STREP SCREEN (MED CTR MEBANE ONLY): STREPTOCOCCUS, GROUP A SCREEN (DIRECT): NEGATIVE

## 2015-09-04 MED ORDER — OXYMETAZOLINE HCL 0.05 % NA SOLN: 1.0000 | Freq: Two times a day (BID) | NASAL | Status: DC

## 2015-09-04 MED ORDER — ACETAMINOPHEN 325 MG PO TABS
650.0000 mg | ORAL_TABLET | Freq: Once | ORAL | Status: AC | PRN
  Administered 2015-09-04: 650 mg via ORAL
  Filled 2015-09-04: qty 2

## 2015-09-04 NOTE — ED Provider Notes (Signed)
CSN: [id number]     Arrival date & time 09/04/15  2142 History   First MD Initiated Contact with Patient 09/04/15 2323     Chief Complaint  Patient presents with  . Sore Throat     (Consider location/radiation/quality/duration/timing/severity/associated sxs/prior Treatment) HPI   Patient is a 40 year old male who presents the ED with complaint of sore throat, onset last night. Patient reports last night he began having a sore throat with associated chills, nasal congestion, dry cough, bilateral ear pain and intermittent nausea. He notes his symptoms worsened throughout the day Pt denies taking any medications PTA. Pt reports he was around his nephew a few days ago who was recently dx with strep throat. Denies fever, headache, visual changes, lightheadedness, sinus pressure, rhinorrhea, facial/neck swelling, trismus, drooling, voice change, SOB, wheezing, CP, abdominal pain, vomiting.   Past Medical History  Diagnosis Date  . Hallux valgus (acquired)     RIGHT   Past Surgical History  Procedure Laterality Date  . Orif  left 5th metacarpal fx  05-29-2013  . Bunionectomy Right 06/24/2013    Procedure: LAPIDUS BUNION;  Surgeon: Larey Dresser, DPM;  Location: [facility];  Service: Podiatry;  Laterality: Right;  . Metatarsal osteotomy Right 06/24/2013    Procedure: AKIN-OSTEOTOMY;  Surgeon: Larey Dresser, DPM;  Location: [facility];  Service: Podiatry;  Laterality: Right;   No family history on file. Social History  Substance Use Topics  . Smoking status: Never Smoker   . Smokeless tobacco: Never Used  . Alcohol Use: Yes     Comment: OCCASIONAL    Review of Systems  Constitutional: Positive for chills.  HENT: Positive for congestion and sore throat. Negative for drooling, facial swelling, rhinorrhea and sinus pressure.   Eyes: Negative for visual disturbance.  Respiratory: Positive for cough. Negative for shortness of breath and wheezing.    Cardiovascular: Negative for chest pain.  Gastrointestinal: Positive for nausea. Negative for vomiting, abdominal pain and diarrhea.  Neurological: Negative for dizziness, light-headedness and headaches.  All other systems reviewed and are negative.     Allergies  Review of patient's allergies indicates no known allergies.  Home Medications   Prior to Admission medications   Medication Sig Start Date End Date Taking? Authorizing Provider  ibuprofen (ADVIL,MOTRIN) 200 MG tablet Take 400 mg by mouth every 6 (six) hours as needed for fever or moderate pain.    Historical Provider, MD  naproxen (NAPROSYN) 500 MG tablet Take 1 tablet (500 mg total) by mouth 2 (two) times daily. 08/23/15   Joycie Peek, PA-C  oxymetazoline (AFRIN NASAL SPRAY) 0.05 % nasal spray Place 1 spray into both nostrils 2 (two) times daily. 09/04/15   Satira Sark Nadeau, PA-C   BP 136/81 mmHg  Pulse 99  Temp(Src) 100.9 F (38.3 C) (Oral)  Resp 18  Ht  (1.702 m)  Wt 92.987 kg  BMI 32.10 kg/m2  SpO2 95% Physical Exam  Constitutional: He is oriented to person, place, and time. He appears well-developed and well-nourished. No distress.  HENT:  Head: Normocephalic and atraumatic.  Right Ear: Hearing, external ear and ear canal normal. No mastoid tenderness. A middle ear effusion is present.  Left Ear: Hearing, external ear and ear canal normal. No mastoid tenderness. A middle ear effusion is present.  Nose: Nose normal. No rhinorrhea. Right sinus exhibits no maxillary sinus tenderness and no frontal sinus tenderness. Left sinus exhibits no maxillary sinus tenderness and no frontal sinus tenderness.  Mouth/Throat: Uvula is  midline, oropharynx is clear and moist and mucous membranes are normal. No oral lesions. No trismus in the jaw. No uvula swelling. No oropharyngeal exudate, posterior oropharyngeal edema, posterior oropharyngeal erythema or tonsillar abscesses.  Eyes: Conjunctivae and EOM are normal.  Pupils are equal, round, and reactive to light. Right eye exhibits no discharge. Left eye exhibits no discharge. No scleral icterus.  Neck: Normal range of motion. Neck supple.  Cardiovascular: Normal rate, regular rhythm, normal heart sounds and intact distal pulses.   Pulmonary/Chest: Effort normal and breath sounds normal. No respiratory distress. He has no wheezes. He has no rales. He exhibits no tenderness.  Abdominal: Soft. Bowel sounds are normal. He exhibits no distension. There is no tenderness.  Musculoskeletal: Normal range of motion. He exhibits no edema.  Lymphadenopathy:    He has no cervical adenopathy.  Neurological: He is alert and oriented to person, place, and time.  Skin: Skin is warm and dry. He is not diaphoretic.  Nursing note and vitals reviewed.   ED Course  Procedures (including critical care time) Labs Review Labs Reviewed  RAPID STREP SCREEN (NOT AT [facility])  CULTURE, GROUP A STREP [facility])    Imaging Review No results found. I have personally reviewed and evaluated these images and lab results as part of my medical decision-making.   EKG Interpretation None      MDM   Final diagnoses:  URI (upper respiratory infection)    Patient presents with sore throat, nasal congestion, ear pain, nonproductive cough and chills. Initial temp 100.9, pt given tylenol in the ED. Remaining vitals stable. Exam revealed bilateral middle ear effusion, remaining exam unremarkable. Lungs clear to auscultation bilaterally. Strep negative. Patients symptoms are consistent with URI, likely viral etiology. Discussed that antibiotics are not indicated for viral infections. Pt will be discharged with symptomatic treatment.  Verbalizes understanding and is agreeable with plan. Pt is hemodynamically stable & in NAD prior to dc. Discussed return precautions with patient.    Satira Sarkicole Elizabeth HominyNadeau, [place] JerseyPA-C 09/04/15 2357  Laurence Spatesachel Morgan Little, MD 09/05/15 [phone]

## 2015-09-04 NOTE — Discharge Instructions (Signed)
Take your medication as prescribed. I also recommend continuing to take Tylenol and/or ibuprofen as prescribed over-the-counter as needed for fever and body aches. I recommend continuing to drink at least six 8 8 ounce glasses of water daily to remain hydrated at home. Please follow up with a primary care provider from the Resource Guide provided below in 5-7 days if your symptoms have not improved. Please return to the Emergency Department if symptoms worsen or new onset of fever, headache, neck stiffness, facial/neck stiffness, unable to swallow resulting in drooling, difficulty breathing, unable to keep fluids down.

## 2015-09-04 NOTE — ED Notes (Signed)
Pt says he started with a sore throat last night. This afternoon having chills and congestion.

## 2015-09-04 NOTE — ED Notes (Signed)
Pt reports understanding of discharge information. No questions at time of discharge 

## 2015-09-07 LAB — CULTURE, GROUP A STREP (THRC)

## 2015-11-29 ENCOUNTER — Emergency Department (HOSPITAL_COMMUNITY): Payer: Self-pay

## 2015-11-29 ENCOUNTER — Emergency Department (HOSPITAL_COMMUNITY)
Admission: EM | Admit: 2015-11-29 | Discharge: 2015-11-29 | Disposition: A | Discharge status: Home / Self Care | Payer: Self-pay | Attending: Emergency Medicine | Admitting: Emergency Medicine

## 2015-11-29 ENCOUNTER — Encounter (HOSPITAL_COMMUNITY): Payer: Self-pay

## 2015-11-29 DIAGNOSIS — B349 Viral infection, unspecified: Secondary | ICD-10-CM | POA: Insufficient documentation

## 2015-11-29 DIAGNOSIS — R52 Pain, unspecified: Secondary | ICD-10-CM

## 2015-11-29 DIAGNOSIS — Z791 Long term (current) use of non-steroidal anti-inflammatories (NSAID): Secondary | ICD-10-CM | POA: Insufficient documentation

## 2015-11-29 LAB — CBC WITH DIFFERENTIAL/PLATELET
Basophils Absolute: 0 10*3/uL (ref 0.0–0.1)
Basophils Relative: 0 %
EOS ABS: 0.1 10*3/uL (ref 0.0–0.7)
EOS PCT: 1 %
HCT: 38 % — ABNORMAL LOW (ref 39.0–52.0)
HEMOGLOBIN: 12.5 g/dL — AB (ref 13.0–17.0)
Lymphocytes Relative: 35 %
Lymphs Abs: 1.9 10*3/uL (ref 0.7–4.0)
MCH: 29.3 pg (ref 26.0–34.0)
MCHC: 32.9 g/dL (ref 30.0–36.0)
MCV: 89 fL (ref 78.0–100.0)
Monocytes Absolute: 0.8 10*3/uL (ref 0.1–1.0)
Monocytes Relative: 14 %
NEUTROS PCT: 50 %
Neutro Abs: 2.8 10*3/uL (ref 1.7–7.7)
PLATELETS: 152 10*3/uL (ref 150–400)
RBC: 4.27 MIL/uL (ref 4.22–5.81)
RDW: 12.9 % (ref 11.5–15.5)
WBC: 5.6 10*3/uL (ref 4.0–10.5)

## 2015-11-29 LAB — BASIC METABOLIC PANEL
Anion gap: 10 (ref 5–15)
BUN: 15 mg/dL (ref 6–20)
CALCIUM: 9.1 mg/dL (ref 8.9–10.3)
CHLORIDE: 102 mmol/L (ref 101–111)
CO2: 25 mmol/L (ref 22–32)
CREATININE: 1.47 mg/dL — AB (ref 0.61–1.24)
GFR calc Af Amer: >60 mL/min (ref >60)
GFR calc non Af Amer: 57 mL/min — ABNORMAL LOW (ref >60)
Glucose, Bld: 111 mg/dL — ABNORMAL HIGH (ref 65–99)
Potassium: 3.3 mmol/L — ABNORMAL LOW (ref 3.5–5.1)
SODIUM: 137 mmol/L (ref 135–145)

## 2015-11-29 LAB — URINALYSIS, ROUTINE W REFLEX MICROSCOPIC
BILIRUBIN URINE: NEGATIVE
Glucose, UA: NEGATIVE mg/dL
KETONES UR: NEGATIVE mg/dL
Leukocytes, UA: NEGATIVE
Nitrite: NEGATIVE
PH: 5.5 (ref 5.0–8.0)
Protein, ur: NEGATIVE mg/dL
SPECIFIC GRAVITY, URINE: 1.016 (ref 1.005–1.030)

## 2015-11-29 LAB — URINE MICROSCOPIC-ADD ON

## 2015-11-29 LAB — CK: Total CK: 847 U/L — ABNORMAL HIGH (ref 49–397)

## 2015-11-29 LAB — I-STAT CG4 LACTIC ACID, ED: Lactic Acid, Venous: 0.82 mmol/L (ref 0.5–1.9)

## 2015-11-29 MED ORDER — ACETAMINOPHEN 325 MG PO TABS
650.0000 mg | ORAL_TABLET | Freq: Once | ORAL | Status: AC
  Administered 2015-11-29: 650 mg via ORAL
  Filled 2015-11-29: qty 2

## 2015-11-29 MED ORDER — ONDANSETRON 4 MG PO TBDP: 4.0000 mg | ORAL_TABLET | Freq: Three times a day (TID) | ORAL | 0 refills | Status: DC | PRN

## 2015-11-29 MED ORDER — SODIUM CHLORIDE 0.9 % IV BOLUS (SEPSIS)
1000.0000 mL | Freq: Once | INTRAVENOUS | Status: AC
  Administered 2015-11-29: 1000 mL via INTRAVENOUS

## 2015-11-29 MED ORDER — NAPROXEN 500 MG PO TABS: 500.0000 mg | ORAL_TABLET | Freq: Two times a day (BID) | ORAL | 0 refills | Status: DC

## 2015-11-29 MED ORDER — KETOROLAC TROMETHAMINE 30 MG/ML IJ SOLN
30.0000 mg | Freq: Once | INTRAMUSCULAR | Status: AC
  Administered 2015-11-29: 30 mg via INTRAVENOUS
  Filled 2015-11-29: qty 1

## 2015-11-29 MED ORDER — ONDANSETRON HCL 4 MG/2ML IJ SOLN
4.0000 mg | Freq: Once | INTRAMUSCULAR | Status: AC
  Administered 2015-11-29: 4 mg via INTRAVENOUS
  Filled 2015-11-29: qty 2

## 2015-11-29 NOTE — ED Notes (Signed)
Pt stated he has a rash on his left side around his rib cage.  Small raised reddened areas noted.

## 2015-11-29 NOTE — ED Provider Notes (Signed)
Frederick Lyons is a 40 y.o. male, patient with no pertinent past medical history, presenting to the ED with body aches and rash on the chest and left arm for the last 2 days. Also endorses fever and nausea. Denies neck stiffness, coughing, shortness of breath, chest pain, vomiting, or any other complaints.   Past Medical History:  Diagnosis Date  . Hallux valgus (acquired)    RIGHT     Physical Exam  BP 97/58 (BP Location: Right Arm)   Pulse 78   Temp 98.6 F (37 C) (Oral)   Resp 16   SpO2 94%   Physical Exam  Constitutional: He appears well-developed and well-nourished. No distress.  HENT:  Head: Normocephalic and atraumatic.  Eyes: Conjunctivae are normal.  Neck: Neck supple.  Cardiovascular: Normal rate, regular rhythm, normal heart sounds and intact distal pulses.   Pulmonary/Chest: Effort normal and breath sounds normal. No respiratory distress.  Abdominal: Soft. There is no tenderness. There is no guarding.  Musculoskeletal: He exhibits tenderness. He exhibits no edema.  Generalized muscular tenderness. Full ROM in all extremities and spine. No midline spinal tenderness.   Lymphadenopathy:    He has no cervical adenopathy.  Neurological: He is alert.  Skin: Skin is warm and dry. He is not diaphoretic.  Psychiatric: He has a normal mood and affect. His behavior is normal.  Nursing note and vitals reviewed.  Orthostatic VS for the past 24 hrs:  BP- Lying Pulse- Lying BP- Sitting Pulse- Sitting BP- Standing at 0 minutes Pulse- Standing at 0 minutes  11/29/15 1005 97/70 71 112/80 74 107/77 92     ED Course  Procedures  MDM Frederick Lyons presents with body aches and fever for the last 2 days.  Findings and plan of care discussed with Everlene Balls, MD. Dr. Claudine Mouton personally evaluated and examined this patient. This patient was also discussed with Dr. Alvino Chapel after EDP shift change.  Took patient care handoff report from Delos Haring, PA-C. Plan: Finish second  liter bolus, review CK level. Plan on discharge with a viral syndrome diagnosis.  Patient CK noted to be elevated. However, the elevation is nowhere near the limit for rhabdomyolysis diagnosis. Lower than expected BP is noted and has been stable. Upon my evaluation, patient states he feels somewhat better. Rates his body aches at 6 or 7 out of 10. Denies current nausea. Denies additional symptoms. States his BP is usually "normal, like it was when I first got here." A short time later, patient was noted to be sleeping comfortably on the bed. Patient is nontoxic appearing, afebrile, not tachycardic, not tachypneic, maintains adequate SPO2 on room air, and is in no apparent distress. Pt appears stable for discharge at this time. No admission criteria met. Patient ambulated without difficulty or assistance. The patient was given instructions for home care as well as return precautions. Patient voices understanding of these instructions, accepts the plan, and is comfortable with discharge.   Vitals:   11/29/15 0159 11/29/15 0302 11/29/15 0445 11/29/15 0655  BP: 123/80  (!) 105/49 97/58  Pulse: 106  81 78  Resp: _0 Temp: 101.6 F (38.7 C) 98.6 F (37 C) 98.6 F (37 C)   TempSrc:  Oral Oral   SpO2: 92%  95% 94%   Vitals:   11/29/15 0715 11/29/15 0730 11/29/15 0745 11/29/15 0909  BP:    96/59  Pulse: 75 81 71 79  Resp:    15  Temp:  97.9 F (36.6 C)  TempSrc:    Oral  SpO2: 94% 95% 96% 95%   Vitals:   11/29/15 0730 11/29/15 0745 11/29/15 0909 11/29/15 1054  BP:   96/59 105/72  Pulse: 81 71 79   Resp:   15 18  Temp:   97.9 F (36.6 C) 97.8 F (36.6 C)  TempSrc:   Oral Oral  SpO2: 95% 96% 95% 95%      Lorayne Bender, PA-C 11/29/15 1614    Everlene Balls, MD 11/29/15 2123

## 2015-11-29 NOTE — ED Provider Notes (Signed)
[facility]-EMERGENCY DEPT Provider Note   CSN: [id number] Arrival date & time: 11/29/15  0159     History   Chief Complaint Chief Complaint  Patient presents with  . Generalized Body Aches    HPI Frederick PersonsBarry L Lyons is a 40 y.o. male.  HPI   Patient presents to the emergency department with complaints of generalized body aches and rash. The symptoms started 2 days ago, he denies being bit by a tick that he is aware of. He also has been having subjective fevers and sweating. He has NOT had any coughing  Past Medical History:  Diagnosis Date  . Hallux valgus (acquired)    RIGHT    There are no active problems to display for this patient.   Past Surgical History:  Procedure Laterality Date  . BUNIONECTOMY Right 06/24/2013   Procedure: LAPIDUS BUNION;  Surgeon: Larey DresserMartha Ajlouny, DPM;  [facility] [place];  Service: Podiatry;  Laterality: Right;  . METATARSAL OSTEOTOMY Right 06/24/2013   Procedure: AKIN-OSTEOTOMY;  Surgeon: Larey DresserMartha Ajlouny, DPM;  [facility] [place];  Service: Podiatry;  Laterality: Right;  . ORIF  LEFT 5TH METACARPAL FX  05-29-2013       Home Medications    Prior to Admission medications   Medication Sig Start Date End Date Taking? Authorizing Provider  ibuprofen (ADVIL,MOTRIN) 200 MG tablet Take 400 mg by mouth every 6 (six) hours as needed for fever or moderate pain.   Yes Historical Provider, MD  naproxen (NAPROSYN) 500 MG tablet Take 1 tablet (500 mg total) by mouth 2 (two) times daily. Patient not taking: Reported on 11/29/2015 08/23/15   Joycie PeekBenjamin Cartner, PA-C  oxymetazoline (AFRIN NASAL SPRAY) 0.05 % nasal spray Place 1 spray into both nostrils 2 (two) times daily. Patient not taking: Reported on 11/29/2015 09/04/15   Barrett HenleNicole Elizabeth Nadeau, PA-C    Family History History reviewed. No pertinent family history.  Social History Social History  Substance Use Topics  . Smoking status: Never Smoker  . Smokeless tobacco:  Never Used  . Alcohol use Yes     Comment: OCCASIONAL     Allergies   Review of patient's allergies indicates no known allergies.   Review of Systems Review of Systems  Review of Systems All other systems negative except as documented in the HPI. All pertinent positives and negatives as reviewed in the HPI. Physical Exam Updated Vital Signs BP (!) 105/49 (BP Location: Right Arm)   Pulse 81   Temp 98.6 F (37 C) (Oral)   Resp 16   SpO2 95%   Physical Exam  Constitutional: He is oriented to person, place, and time. He appears well-developed and well-nourished. He appears ill.  HENT:  Head: Normocephalic and atraumatic.  Eyes: EOM are normal. Pupils are equal, round, and reactive to light.  Neck: Normal range of motion.  Cardiovascular: Normal rate and regular rhythm.   Pulmonary/Chest: Effort normal and breath sounds normal.    Musculoskeletal: Normal range of motion.  Neurological: He is alert and oriented to person, place, and time.  Skin: Skin is warm. He is diaphoretic.     ED Treatments / Results  Labs (all labs ordered are listed, but only abnormal results are displayed) Labs Reviewed - No data to display  EKG  EKG Interpretation None       Radiology No results found.  Procedures Procedures (including critical care time)  Medications Ordered in ED Medications  acetaminophen (TYLENOL) tablet 650 mg (650 mg Oral Given 11/29/15 0218)  Initial Impression / Assessment and Plan / ED Course  I have reviewed the triage vital signs and the nursing notes.  Pertinent labs & imaging results that were available during my care of the patient were reviewed by me and considered in my medical decision making (see chart for details).  Clinical Course    I asked Dr. Mora Bellmanni to see patient as well to assist in plan for work up. He recommends CBC, BMP,CK. Treatment, IV Toradol and fluids. Dr. Mora Bellmanni believes that he likely is having a viral syndrome. At end of shift  patient sign out to Dole FoodShawn Joy, [place]PA-C.   Plan: Patient will need to receive 2 liter of fluids and results from blood work. I anticipate patient to recover well and be safe for discharge,  Final Clinical Impressions(s) / ED Diagnoses   Final diagnoses:  None    New Prescriptions New Prescriptions   No medications on file     Marlon Peliffany Mikena Masoner, PA-C 11/29/15 2331    Tomasita CrumbleAdeleke Oni, MD 11/30/15 [phone]

## 2015-11-29 NOTE — ED Triage Notes (Signed)
Pt complains of bilateral leg cramps and general body aches for three days, pt also has a fever

## 2015-11-29 NOTE — ED Notes (Signed)
He is drowsy and comfortable in appearance.

## 2015-11-29 NOTE — Discharge Instructions (Signed)
You have been seen today for body aches, rash, and fever. There were signs of dehydration on your labs. Your symptoms are consistent with a viral illness. Viruses do not require antibiotics. Treatment is symptomatic care. Drink plenty of fluids and get plenty of rest. You should be drinking at least a liter of water an hour to stay hydrated. Ibuprofen, Naproxen, or Tylenol for pain or fever. Zofran for nausea. Follow up with PCP as needed. Return to ED should symptoms worsen.

## 2016-10-09 ENCOUNTER — Emergency Department (HOSPITAL_COMMUNITY)
Admission: EM | Admit: 2016-10-09 | Discharge: 2016-10-09 | Disposition: A | Discharge status: Home / Self Care | Payer: Self-pay | Attending: Emergency Medicine | Admitting: Emergency Medicine

## 2016-10-09 ENCOUNTER — Encounter (HOSPITAL_COMMUNITY): Payer: Self-pay

## 2016-10-09 DIAGNOSIS — T675XXA Heat exhaustion, unspecified, initial encounter: Secondary | ICD-10-CM | POA: Insufficient documentation

## 2016-10-09 LAB — COMPREHENSIVE METABOLIC PANEL
ALT: 35 U/L (ref 17–63)
ANION GAP: 10 (ref 5–15)
AST: 47 U/L — ABNORMAL HIGH (ref 15–41)
Albumin: 4.7 g/dL (ref 3.5–5.0)
Alkaline Phosphatase: 64 U/L (ref 38–126)
BUN: 15 mg/dL (ref 6–20)
CO2: 26 mmol/L (ref 22–32)
CREATININE: 1.18 mg/dL (ref 0.61–1.24)
Calcium: 9.5 mg/dL (ref 8.9–10.3)
Chloride: 104 mmol/L (ref 101–111)
Glucose, Bld: 118 mg/dL — ABNORMAL HIGH (ref 65–99)
POTASSIUM: 3.4 mmol/L — AB (ref 3.5–5.1)
SODIUM: 140 mmol/L (ref 135–145)
Total Bilirubin: 0.5 mg/dL (ref 0.3–1.2)
Total Protein: 7.9 g/dL (ref 6.5–8.1)

## 2016-10-09 LAB — CBC
HCT: 40 % (ref 39.0–52.0)
Hemoglobin: 12.8 g/dL — ABNORMAL LOW (ref 13.0–17.0)
MCH: 29.4 pg (ref 26.0–34.0)
MCHC: 32 g/dL (ref 30.0–36.0)
MCV: 91.7 fL (ref 78.0–100.0)
PLATELETS: 197 10*3/uL (ref 150–400)
RBC: 4.36 MIL/uL (ref 4.22–5.81)
RDW: 12.3 % (ref 11.5–15.5)
WBC: 8 10*3/uL (ref 4.0–10.5)

## 2016-10-09 LAB — CK: CK TOTAL: 1013 U/L — AB (ref 49–397)

## 2016-10-09 MED ORDER — SODIUM CHLORIDE 0.9 % IV BOLUS (SEPSIS)
1000.0000 mL | Freq: Once | INTRAVENOUS | Status: AC
  Administered 2016-10-09: 1000 mL via INTRAVENOUS

## 2016-10-09 NOTE — ED Triage Notes (Signed)
Pt states he works outside and has been drinking water and gatorade, he feels like he has the chills and states that he's had diarrhea for three days

## 2016-10-09 NOTE — ED Provider Notes (Signed)
[facility] DEPT Provider Note: Lowella DellJ. Lane Sylvi Rybolt, MD, FACEP  CSN: [id number] MRN: [id number] ARRIVAL: 10/09/16 at 0039 ROOM: WA19/[facility]19   CHIEF COMPLAINT  Overheated   HISTORY OF PRESENT ILLNESS  Frederick Lyons is a 41 y.o. male who states he has been overheated at work the past 2 days. He has been sweating profusely and although he has attempted to keep hydrated (with water and Gatorade) he began experiencing generalized muscle cramping yesterday. He describes these cramps as "pretty bad" and more prominent in the arms and legs as well as the lower back. An IV fluid bolus was initiated per protocol on arrival and he is feeling better this time. He does not know if his urine has been dark. He states he has had diarrhea for 3 days.   Past Medical History:  Diagnosis Date  . Hallux valgus (acquired)    RIGHT    Past Surgical History:  Procedure Laterality Date  . BUNIONECTOMY Right 06/24/2013   Procedure: LAPIDUS BUNION;  Surgeon: Larey DresserMartha Ajlouny, DPM;  [facility] [place];  Service: Podiatry;  Laterality: Right;  . METATARSAL OSTEOTOMY Right 06/24/2013   Procedure: AKIN-OSTEOTOMY;  Surgeon: Larey DresserMartha Ajlouny, DPM;  [facility] [place];  Service: Podiatry;  Laterality: Right;  . ORIF  LEFT 5TH METACARPAL FX  05-29-2013    History reviewed. No pertinent family history.  Social History  Substance Use Topics  . Smoking status: Never Smoker  . Smokeless tobacco: Never Used  . Alcohol use Yes     Comment: OCCASIONAL    Prior to Admission medications   Medication Sig Start Date End Date Taking? Authorizing Provider  albuterol (PROVENTIL HFA;VENTOLIN HFA) 108 (90 Base) MCG/ACT inhaler Inhale 1-2 puffs into the lungs every 6 (six) hours as needed for wheezing or shortness of breath.   Yes [provider]  aspirin EC 81 MG tablet Take 81 mg by mouth every 6 (six) hours as needed (for headache).   Yes [provider]  ibuprofen  (ADVIL,MOTRIN) 200 MG tablet Take 800 mg by mouth every 6 (six) hours as needed for headache, mild pain or moderate pain.   Yes [provider]    Allergies Patient has no known allergies.   REVIEW OF SYSTEMS  Negative except as noted here or in the History of Present Illness.   PHYSICAL EXAMINATION  Initial Vital Signs Blood pressure (!) 146/94, pulse 65, temperature 97.7 F (36.5 C), temperature source Oral, resp. rate 18, SpO2 98 %.  Examination General: Well-developed, well-nourished male in no acute distress; appearance consistent with age of record HENT: normocephalic; atraumatic Eyes: pupils equal, round and reactive to light; extraocular muscles intact; arcus senilis bilaterally Neck: supple Heart: regular rate and rhythm Lungs: clear to auscultation bilaterally Abdomen: soft; nondistended; nontender; no masses or hepatosplenomegaly; bowel sounds present Extremities: No deformity; full range of motion; pulses normal; muscles nontender Neurologic: Sleeping but readily awakened; motor function intact in all extremities and symmetric; no facial droop Skin: Warm and dry Psychiatric: Normal mood and affect   RESULTS  Summary of this visit's results, reviewed by myself:   EKG Interpretation  Date/Time:    Ventricular Rate:    PR Interval:    QRS Duration:   QT Interval:    QTC Calculation:   R Axis:     Text Interpretation:        Laboratory Studies: Results for orders placed or performed during the hospital encounter of 10/09/16 (from the past 24 hour(s))  CBC  Status: Abnormal   Collection Time: 10/09/16  1:25 AM  Result Value Ref Range   WBC 8.0 4.0 - 10.5 K/uL   RBC 4.36 4.22 - 5.81 MIL/uL   Hemoglobin 12.8 (L) 13.0 - 17.0 g/dL   HCT 16.1 09.6 - 04.5 %   MCV 91.7 78.0 - 100.0 fL   MCH 29.4 26.0 - 34.0 pg   MCHC 32.0 30.0 - 36.0 g/dL   RDW 40.9 81.1 - 91.4 %   Platelets 197 150 - 400 K/uL  Comprehensive metabolic panel     Status: Abnormal     Collection Time: 10/09/16  1:25 AM  Result Value Ref Range   Sodium 140 135 - 145 mmol/L   Potassium 3.4 (L) 3.5 - 5.1 mmol/L   Chloride 104 101 - 111 mmol/L   CO2 26 22 - 32 mmol/L   Glucose, Bld 118 (H) 65 - 99 mg/dL   BUN 15 6 - 20 mg/dL   Creatinine, Ser 7.82 0.61 - 1.24 mg/dL   Calcium 9.5 8.9 - 95.6 mg/dL   Total Protein 7.9 6.5 - 8.1 g/dL   Albumin 4.7 3.5 - 5.0 g/dL   AST 47 (H) 15 - 41 U/L   ALT 35 17 - 63 U/L   Alkaline Phosphatase 64 38 - 126 U/L   Total Bilirubin 0.5 0.3 - 1.2 mg/dL   GFR calc non Af Amer >60 >60 mL/min   GFR calc Af Amer >60 >60 mL/min   Anion gap 10 5 - 15  CK     Status: Abnormal   Collection Time: 10/09/16  1:25 AM  Result Value Ref Range   Total CK 1,013 (H) 49 - 397 U/L   Imaging Studies: No results found.  ED COURSE  Nursing notes and initial vitals signs, including pulse oximetry, reviewed.  Vitals:   10/09/16 0100 10/09/16 0248  BP: (!) 146/94 109/62  Pulse: 65 74  Resp: 18 18  Temp: 97.7 F (36.5 C) 97.9 F (36.6 C)  TempSrc: Oral Oral  SpO2: 98% 98%   3:50 AM Patient denies muscle cramping at this time. He has been hydrated with a liter of normal saline. His total CK is mildly elevated consistent with mild rhabdomyolysis. It is not high enough to warrant inpatient admission. He was advised to stay well hydrated at home and to drink sufficient fluids to keep his urine clear.  PROCEDURES    ED DIAGNOSES     ICD-10-CM   1. Heat exhaustion, initial encounter T67.Danne Harbor, MD 10/09/16 [phone]

## 2017-04-07 ENCOUNTER — Emergency Department (HOSPITAL_COMMUNITY)
Admission: EM | Admit: 2017-04-07 | Discharge: 2017-04-07 | Disposition: A | Discharge status: Home / Self Care | Payer: Self-pay | Attending: Emergency Medicine | Admitting: Emergency Medicine

## 2017-04-07 DIAGNOSIS — S01112A Laceration without foreign body of left eyelid and periocular area, initial encounter: Secondary | ICD-10-CM | POA: Insufficient documentation

## 2017-04-07 DIAGNOSIS — W2201XA Walked into wall, initial encounter: Secondary | ICD-10-CM | POA: Insufficient documentation

## 2017-04-07 DIAGNOSIS — Z23 Encounter for immunization: Secondary | ICD-10-CM | POA: Insufficient documentation

## 2017-04-07 DIAGNOSIS — Z7982 Long term (current) use of aspirin: Secondary | ICD-10-CM | POA: Insufficient documentation

## 2017-04-07 DIAGNOSIS — Y929 Unspecified place or not applicable: Secondary | ICD-10-CM | POA: Insufficient documentation

## 2017-04-07 DIAGNOSIS — Y9389 Activity, other specified: Secondary | ICD-10-CM | POA: Insufficient documentation

## 2017-04-07 DIAGNOSIS — Y999 Unspecified external cause status: Secondary | ICD-10-CM | POA: Insufficient documentation

## 2017-04-07 MED ORDER — LIDOCAINE HCL (PF) 1 % IJ SOLN
5.0000 mL | Freq: Once | INTRAMUSCULAR | Status: AC
  Administered 2017-04-07: 5 mL
  Filled 2017-04-07: qty 5

## 2017-04-07 MED ORDER — IBUPROFEN 200 MG PO TABS
600.0000 mg | ORAL_TABLET | Freq: Once | ORAL | Status: AC
  Administered 2017-04-07: 600 mg via ORAL
  Filled 2017-04-07: qty 3

## 2017-04-07 MED ORDER — BACITRACIN ZINC 500 UNIT/GM EX OINT
TOPICAL_OINTMENT | Freq: Two times a day (BID) | CUTANEOUS | Status: DC
  Administered 2017-04-07: 1 via TOPICAL
  Filled 2017-04-07: qty 0.9

## 2017-04-07 MED ORDER — TETANUS-DIPHTH-ACELL PERTUSSIS 5-2.5-18.5 LF-MCG/0.5 IM SUSP
0.5000 mL | Freq: Once | INTRAMUSCULAR | Status: AC
  Administered 2017-04-07: 0.5 mL via INTRAMUSCULAR
  Filled 2017-04-07: qty 0.5

## 2017-04-07 NOTE — ED Provider Notes (Signed)
[facility]-EMERGENCY DEPT Provider Note   CSN: [id number] Arrival date & time: 04/07/17  1438     History   Chief Complaint Chief Complaint  Patient presents with  . Head Laceration    HPI Frederick Lyons is a 41 y.o. male who presents to the ED with a laceration to the left forehead just above the eyebrow. Patient reports he was playing with his nephews and hit the wall causing the wound. Patient is unsure of last tetanus.  HPI  Past Medical History:  Diagnosis Date  . Hallux valgus (acquired)    RIGHT    There are no active problems to display for this patient.   Past Surgical History:  Procedure Laterality Date  . BUNIONECTOMY Right 06/24/2013   Procedure: LAPIDUS BUNION;  Surgeon: Larey DresserMartha Ajlouny, DPM;  [facility] [place];  Service: Podiatry;  Laterality: Right;  . METATARSAL OSTEOTOMY Right 06/24/2013   Procedure: AKIN-OSTEOTOMY;  Surgeon: Larey DresserMartha Ajlouny, DPM;  [facility] [place];  Service: Podiatry;  Laterality: Right;  . ORIF  LEFT 5TH METACARPAL FX  05-29-2013       Home Medications    Prior to Admission medications   Medication Sig Start Date End Date Taking? Authorizing Provider  albuterol (PROVENTIL HFA;VENTOLIN HFA) 108 (90 Base) MCG/ACT inhaler Inhale 1-2 puffs into the lungs every 6 (six) hours as needed for wheezing or shortness of breath.    [provider]  aspirin EC 81 MG tablet Take 81 mg by mouth every 6 (six) hours as needed (for headache).    [provider]  ibuprofen (ADVIL,MOTRIN) 200 MG tablet Take 800 mg by mouth every 6 (six) hours as needed for headache, mild pain or moderate pain.    [provider]    Family History No family history on file.  Social History Social History   Tobacco Use  . Smoking status: Never Smoker  . Smokeless tobacco: Never Used  Substance Use Topics  . Alcohol use: Yes    Comment: OCCASIONAL  . Drug use: No      Allergies   Patient has no known allergies.   Review of Systems Review of Systems  Constitutional: Negative for diaphoresis.  HENT: Negative.   Eyes: Negative for visual disturbance.  Gastrointestinal: Negative for nausea and vomiting.  Musculoskeletal: Negative for neck pain.  Skin: Positive for wound.  Neurological: Negative for syncope. Headaches: mild.  Psychiatric/Behavioral: Negative for confusion.     Physical Exam Updated Vital Signs BP 119/68 (BP Location: Right Arm)   Pulse 85   Resp 16   SpO2 97%   Physical Exam  Constitutional: He is oriented to person, place, and time. He appears well-developed and well-nourished. No distress.  HENT:  Head: Head is with contusion and with laceration.    Right Ear: Tympanic membrane normal.  Left Ear: Tympanic membrane normal.  Nose: Nose normal.  Mouth/Throat: Uvula is midline and oropharynx is clear and moist. Normal dentition.  Eyes: Conjunctivae and EOM are normal. Pupils are equal, round, and reactive to light.  Neck: Normal range of motion. Neck supple.  Cardiovascular: Normal rate.  Pulmonary/Chest: Effort normal.  Musculoskeletal: Normal range of motion.  Neurological: He is alert and oriented to person, place, and time.  Skin: Skin is warm and dry.  Wound left eyebrow  Psychiatric: He has a normal mood and affect.  Nursing note and vitals reviewed.    ED Treatments / Results  Labs (all labs ordered are listed,  but only abnormal results are displayed) Labs Reviewed - No data to display  Radiology No results found.  Procedures .Marland Kitchen.Laceration Repair Date/Time: 04/07/2017 3:41 PM Performed by: Janne NapoleonNeese, Hope M, NP Authorized by: Janne NapoleonNeese, Hope M, NP   Consent:    Consent obtained:  Verbal   Consent given by:  Patient   Risks discussed:  Pain and poor cosmetic result   Alternatives discussed:  No treatment Anesthesia (see MAR for exact dosages):    Anesthesia method:  Local infiltration   Local  anesthetic:  Lidocaine 1% w/o epi Laceration details:    Location:  Face   Face location:  L eyebrow   Length (cm):  1.5 Repair type:    Repair type:  Simple Pre-procedure details:    Preparation:  Patient was prepped and draped in usual sterile fashion Exploration:    Hemostasis achieved with:  Direct pressure   Wound exploration: entire depth of wound probed and visualized     Contaminated: no   Treatment:    Area cleansed with:  Saline   Amount of cleaning:  Standard   Irrigation solution:  Sterile saline   Irrigation method:  Syringe Approximation:    Approximation:  Close Post-procedure details:    Dressing:  Antibiotic ointment   Patient tolerance of procedure:  Tolerated well, no immediate complications Comments:     Tetanus updated   (including critical care time)  Medications Ordered in ED Medications  ibuprofen (ADVIL,MOTRIN) tablet 600 mg (not administered)  bacitracin ointment (not administered)  Tdap (BOOSTRIX) injection 0.5 mL (not administered)  lidocaine (PF) (XYLOCAINE) 1 % injection 5 mL (5 mLs Infiltration Given 04/07/17 1542)     Initial Impression / Assessment and Plan / ED Course  I have reviewed the triage vital signs and the nursing notes. 41 y.o. male with laceration to the left eye brow after playing with his nephews and hitting his head on the wall. Stable for d/c without neuro deficits. Patient to f/u in 5 days for suture removal. Return precautions discussed.  Final Clinical Impressions(s) / ED Diagnoses   Final diagnoses:  Laceration of left eyebrow, initial encounter    ED Discharge Orders    None       Kerrie Buffaloeese, Hope [place]M, [place] 04/07/17 1551    Mancel BaleWentz, Elliott, MD 04/07/17 [phone]

## 2017-04-07 NOTE — Discharge Instructions (Addendum)
Take tylenol or ibuprofen as needed for pain. °

## 2017-12-11 IMAGING — CR DG CHEST 2V
2 series · 2 of 2 positions shown · non-contrast
Comparison: None.

CLINICAL DATA: Cough and congestion for 3 days.  Fever for 1 day

EXAM:
CHEST  2 VIEW

[w chest pa]
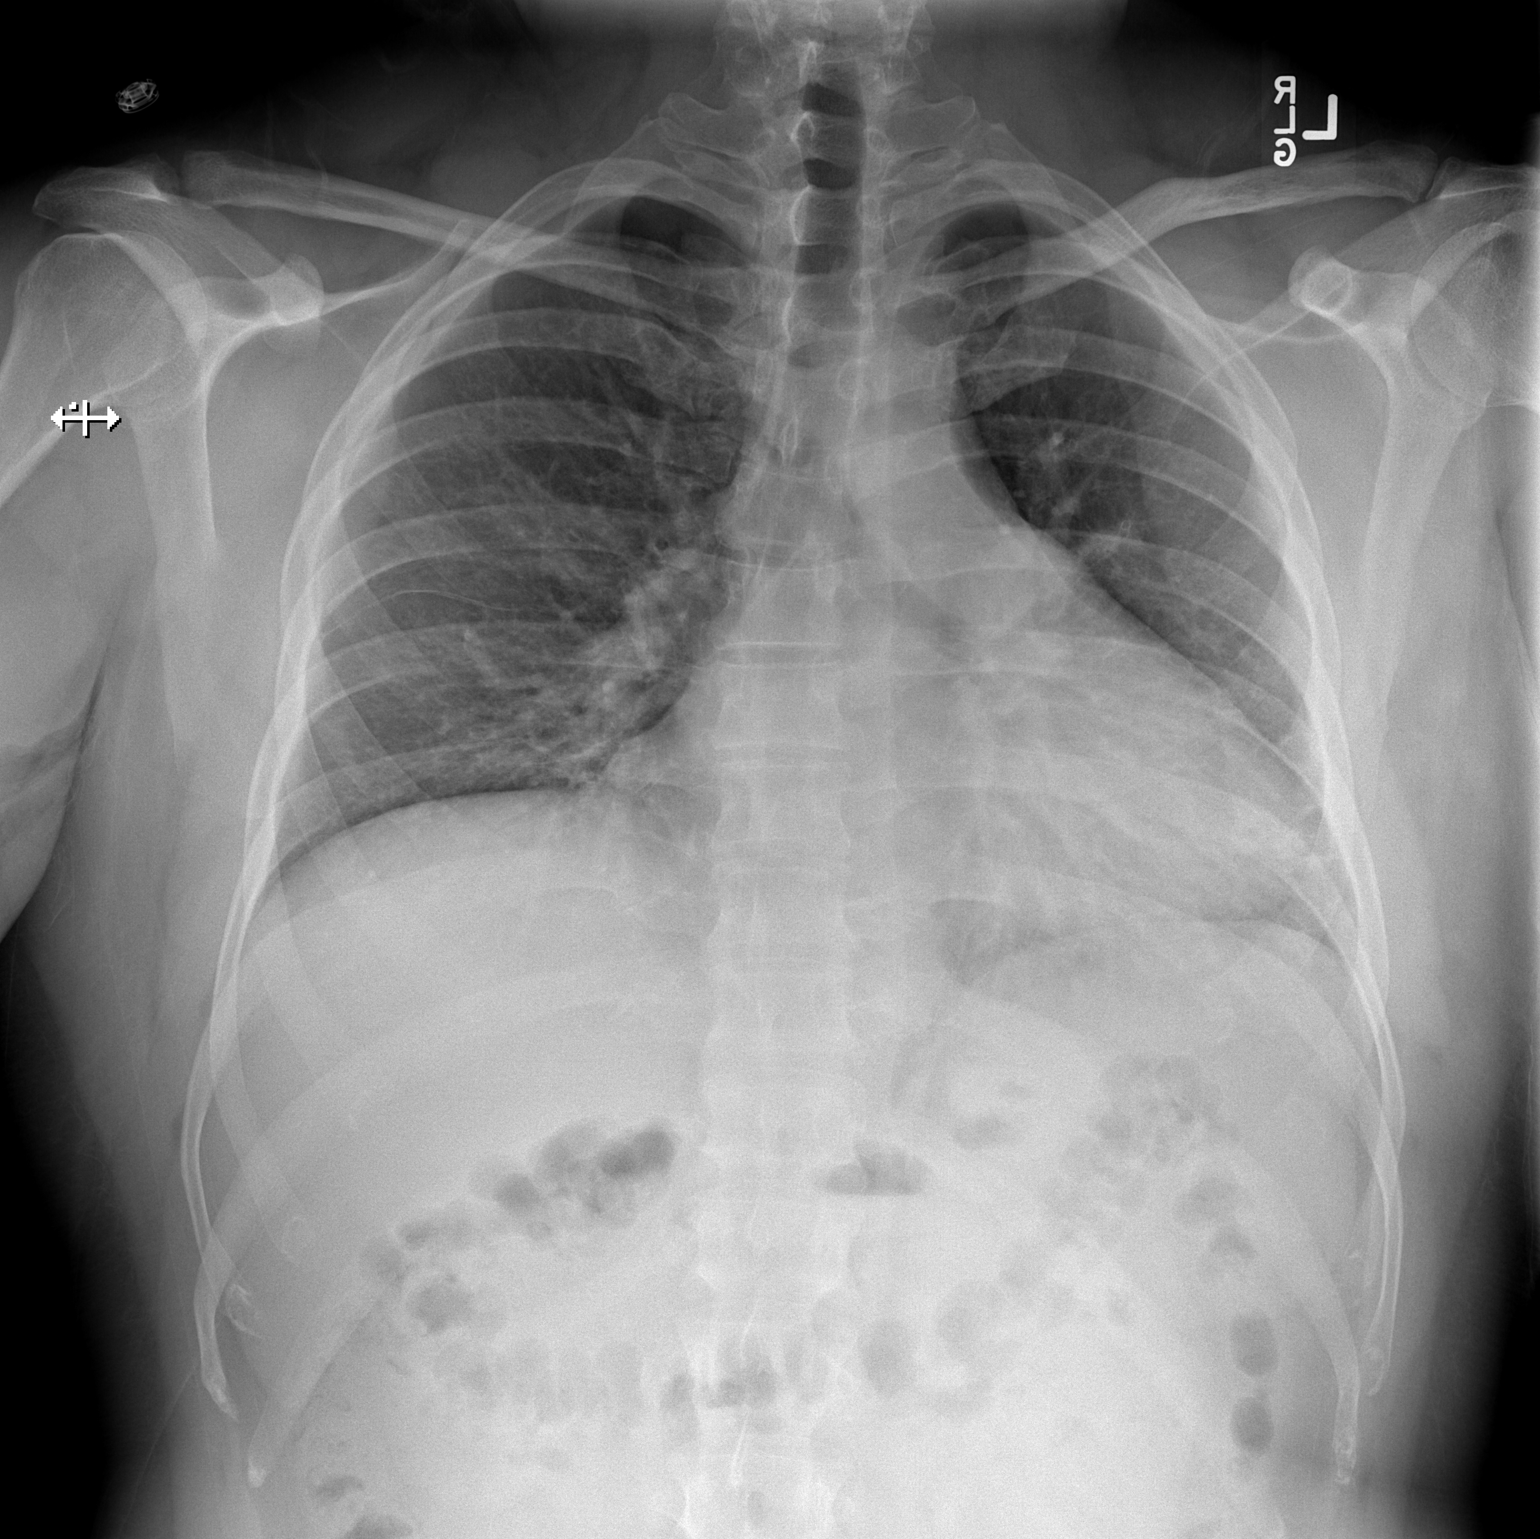

[w chest lat]
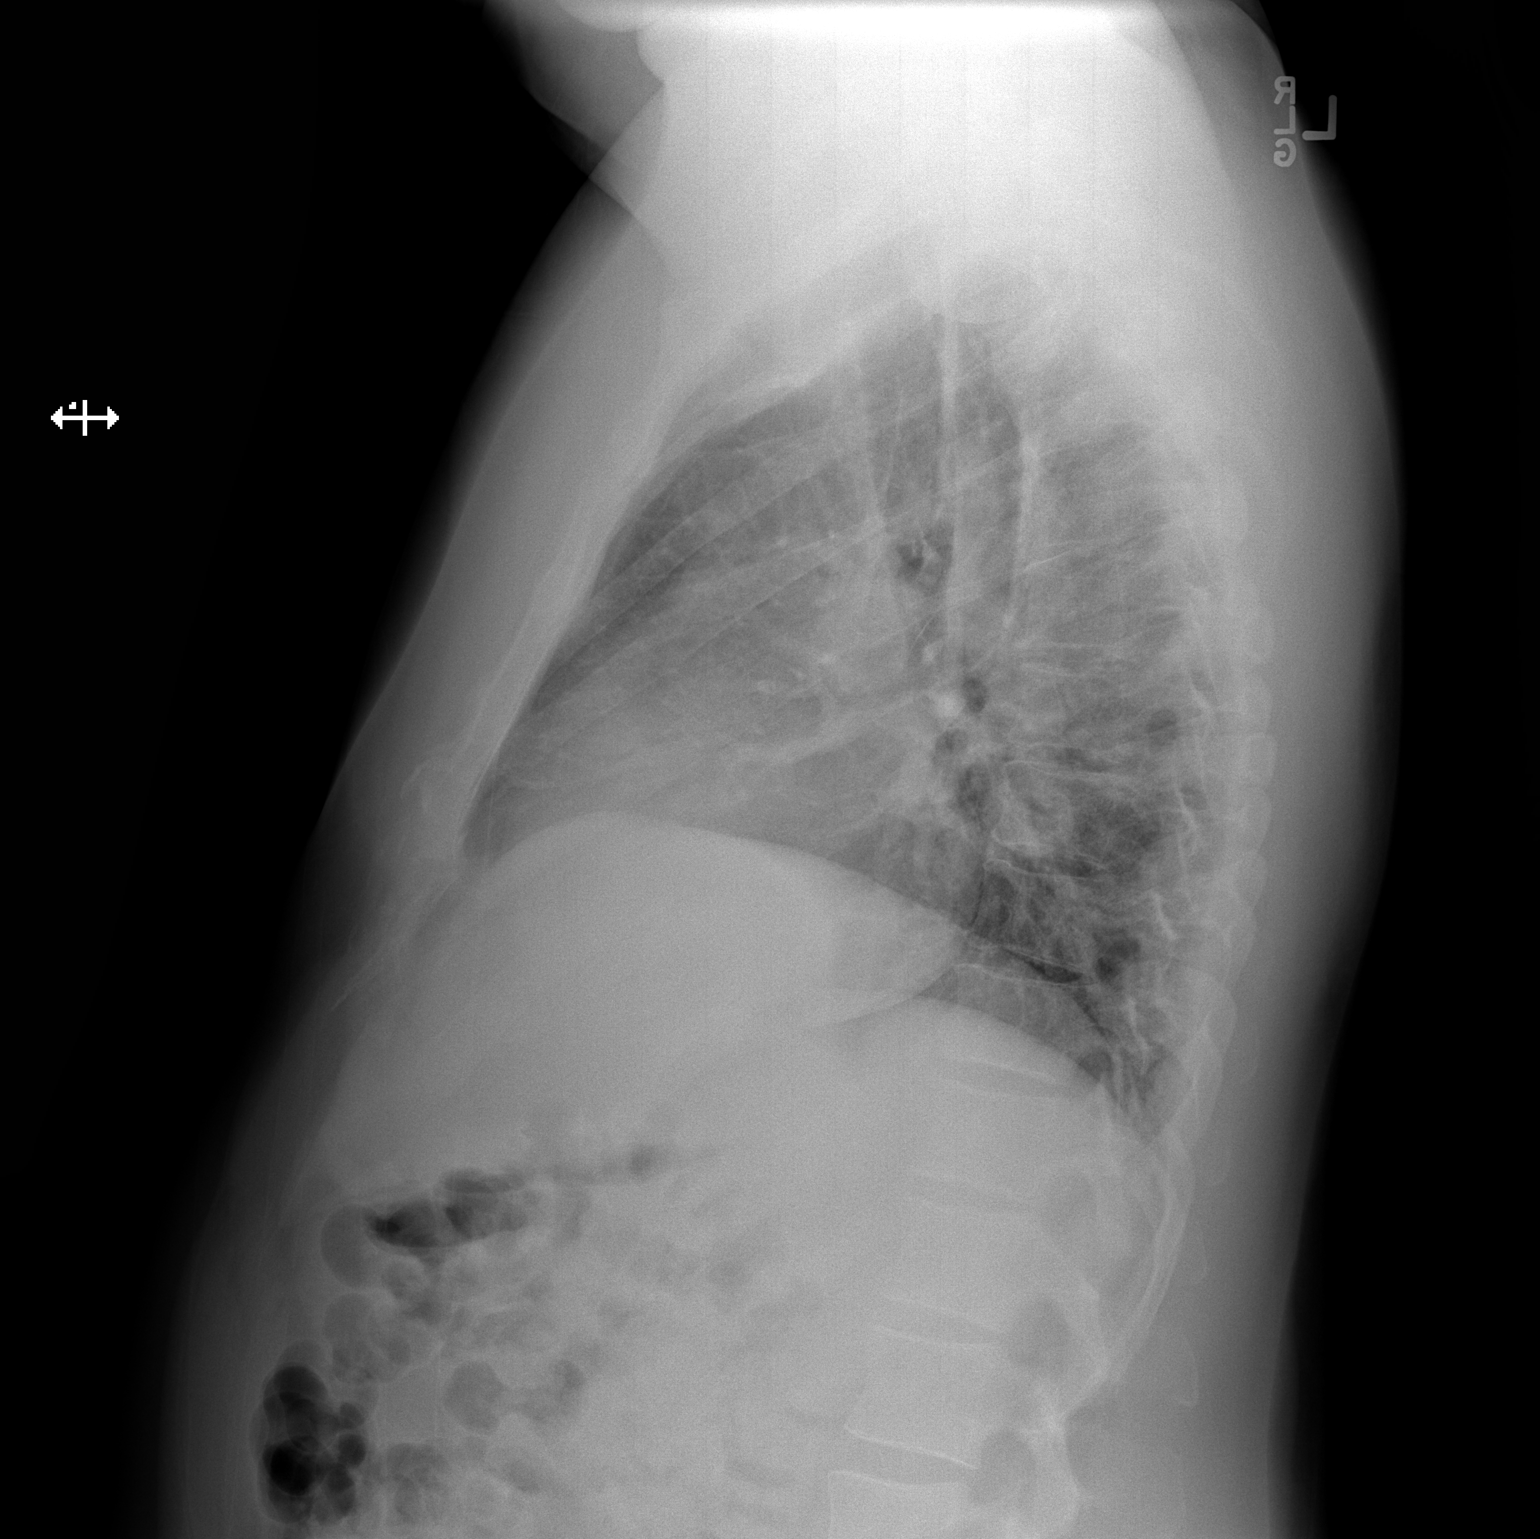

[2 of 2 positions shown; findings below may reference images not displayed]

FINDINGS: There is mild left base atelectasis. Lungs elsewhere clear. Heart is
borderline enlarged with pulmonary vascularity within normal limits.
No adenopathy. No bone lesions.
IMPRESSION: Borderline cardiac enlargement. Mild left base atelectasis. Lungs
elsewhere clear.

## 2018-09-15 ENCOUNTER — Emergency Department (HOSPITAL_COMMUNITY)
Admission: EM | Admit: 2018-09-15 | Discharge: 2018-09-15 | Disposition: A | Discharge status: Home / Self Care | Payer: Self-pay | Attending: Emergency Medicine | Admitting: Emergency Medicine

## 2018-09-15 ENCOUNTER — Emergency Department (HOSPITAL_COMMUNITY): Payer: Self-pay

## 2018-09-15 ENCOUNTER — Encounter (HOSPITAL_COMMUNITY): Payer: Self-pay

## 2018-09-15 ENCOUNTER — Other Ambulatory Visit: Payer: Self-pay

## 2018-09-15 DIAGNOSIS — J4541 Moderate persistent asthma with (acute) exacerbation: Secondary | ICD-10-CM | POA: Insufficient documentation

## 2018-09-15 MED ORDER — ALBUTEROL SULFATE HFA 108 (90 BASE) MCG/ACT IN AERS
4.0000 | INHALATION_SPRAY | Freq: Once | RESPIRATORY_TRACT | Status: AC
  Administered 2018-09-15: 04:00:00 4 via RESPIRATORY_TRACT
  Filled 2018-09-15: qty 6.7

## 2018-09-15 MED ORDER — PREDNISONE 20 MG PO TABS: 60.0000 mg | ORAL_TABLET | Freq: Every day | ORAL | 0 refills | Status: AC

## 2018-09-15 MED ORDER — PREDNISONE 20 MG PO TABS
60.0000 mg | ORAL_TABLET | Freq: Once | ORAL | Status: AC
  Administered 2018-09-15: 60 mg via ORAL
  Filled 2018-09-15: qty 3

## 2018-09-15 NOTE — ED Provider Notes (Signed)
[facility] DEPT Provider Note   CSN: [id number] Arrival date & time: 09/15/18  0254    History   Chief Complaint Chief Complaint  Patient presents with  . Shortness of Breath    HPI Frederick Lyons is a 43 y.o. male.   The history is provided by the patient.  He has history of asthma and comes in because he woke up with difficulty breathing.  He denies cough.  Denies fever, chills, sweats.  He denies chest pain, heaviness, tightness, pressure.  He uses home nebulizer with partial improvement, but he still feels somewhat dyspneic.  He denies exposure to anyone with COVID-19.  He is a non-smoker.  Past Medical History:  Diagnosis Date  . Hallux valgus (acquired)    RIGHT    There are no active problems to display for this patient.   Past Surgical History:  Procedure Laterality Date  . BUNIONECTOMY Right 06/24/2013   Procedure: LAPIDUS BUNION;  Surgeon: Rosemary Holms, DPM;  Location: [facility];  Service: Podiatry;  Laterality: Right;  . METATARSAL OSTEOTOMY Right 06/24/2013   Procedure: AKIN-OSTEOTOMY;  Surgeon: Rosemary Holms, DPM;  Location: [facility];  Service: Podiatry;  Laterality: Right;  . ORIF  LEFT 5TH METACARPAL FX  05-29-2013        Home Medications    Prior to Admission medications   Medication Sig Start Date End Date Taking? Authorizing Provider  albuterol (PROVENTIL HFA;VENTOLIN HFA) 108 (90 Base) MCG/ACT inhaler Inhale 1-2 puffs into the lungs every 6 (six) hours as needed for wheezing or shortness of breath.    [provider]  aspirin EC 81 MG tablet Take 81 mg by mouth every 6 (six) hours as needed (for headache).    [provider]  ibuprofen (ADVIL,MOTRIN) 200 MG tablet Take 800 mg by mouth every 6 (six) hours as needed for headache, mild pain or moderate pain.    [provider]    Family History History reviewed. No pertinent family history.  Social  History Social History   Tobacco Use  . Smoking status: Never Smoker  . Smokeless tobacco: Never Used  Substance Use Topics  . Alcohol use: Yes    Comment: OCCASIONAL  . Drug use: No     Allergies   Patient has no known allergies.   Review of Systems Review of Systems  All other systems reviewed and are negative.    Physical Exam Updated Vital Signs BP (!) 151/91 (BP Location: Left Arm)   Pulse 97   Temp 98.6 F (37 C) (Oral)   Resp 18   Ht 5\' 7"  (1.702 m)   Wt 99.8 kg   SpO2 98%   BMI 34.46 kg/m   Physical Exam Vitals signs and nursing note reviewed.    43 year old male, resting comfortably and in no acute distress. Vital signs are significant for elevated blood pressure. Oxygen saturation is 98%, which is normal. Head is normocephalic and atraumatic. PERRLA, EOMI. Oropharynx is clear. Neck is nontender and supple without adenopathy or JVD. Back is nontender and there is no CVA tenderness. Lungs are clear without rales, wheezes, or rhonchi. Chest is nontender. Heart has regular rate and rhythm without murmur. Abdomen is soft, flat, nontender without masses or hepatosplenomegaly and peristalsis is normoactive. Extremities have no cyanosis or edema, full range of motion is present. Skin is warm and dry without rash. Neurologic: Mental status is normal, cranial nerves are intact, there are no motor or  sensory deficits.  ED Treatments / Results   Radiology Dg Chest Port 1 View  Result Date: 09/15/2018 CLINICAL DATA:  Shortness of breath. EXAM: PORTABLE CHEST 1 VIEW COMPARISON:  11/29/2015 FINDINGS: The cardiac silhouette is enlarged but grossly stable from prior study. There is no pneumothorax. No large pleural effusion. No acute osseous abnormality. IMPRESSION: No active disease. Electronically Signed   By: Katherine Mantlehristopher  Green M.D.   On: 09/15/2018 03:57    Procedures Procedures  Medications Ordered in ED Medications  albuterol (VENTOLIN HFA) 108 (90 Base)  MCG/ACT inhaler 4 puff (4 puffs Inhalation Given 09/15/18 0333)  predniSONE (DELTASONE) tablet 60 mg (60 mg Oral Given 09/15/18 0333)     Initial Impression / Assessment and Plan / ED Course  I have reviewed the triage vital signs and the nursing notes.  Pertinent labs & imaging results that were available during my care of the patient were reviewed by me and considered in my medical decision making (see chart for details).  Acute dyspnea which seems to have resolved.  Probable exacerbation of asthma.  His exam is normal now with completely normal respiratory exam.  He will be given a dose of prednisone and given additional albuterol here.  Will check chest x-ray.  Old records are reviewed, and I see no prior visits for asthma.  He feels much better after above-noted treatment.  Chest x-ray shows no evidence of pneumonia.  He is discharged with a prescription for prednisone.  Advised to continue using his home nebulizer and inhaler as needed.  Final Clinical Impressions(s) / ED Diagnoses   Final diagnoses:  Moderate persistent asthma with exacerbation    ED Discharge Orders         Ordered    predniSONE (DELTASONE) 20 MG tablet  Daily     09/15/18 0437           Dione BoozeGlick, Jovi Zavadil, MD 09/15/18 [phone]

## 2018-09-15 NOTE — ED Triage Notes (Signed)
Pt states he woke up this am feeling like he couldn't breathe, he states that he has asthma but no relief with his inhaler this time, pt denies coughing

## 2018-09-15 NOTE — Discharge Instructions (Addendum)
Continue to use your inhaler and nebulizer as needed.

## 2019-04-09 ENCOUNTER — Emergency Department (HOSPITAL_BASED_OUTPATIENT_CLINIC_OR_DEPARTMENT_OTHER): Payer: HRSA Program

## 2019-04-09 ENCOUNTER — Encounter (HOSPITAL_BASED_OUTPATIENT_CLINIC_OR_DEPARTMENT_OTHER): Payer: Self-pay

## 2019-04-09 ENCOUNTER — Emergency Department (HOSPITAL_BASED_OUTPATIENT_CLINIC_OR_DEPARTMENT_OTHER)
Admission: EM | Admit: 2019-04-09 | Discharge: 2019-04-09 | Disposition: A | Discharge status: Home / Self Care | Payer: HRSA Program | Attending: Emergency Medicine | Admitting: Emergency Medicine

## 2019-04-09 ENCOUNTER — Other Ambulatory Visit: Payer: Self-pay

## 2019-04-09 DIAGNOSIS — Z7982 Long term (current) use of aspirin: Secondary | ICD-10-CM | POA: Diagnosis not present

## 2019-04-09 DIAGNOSIS — Z79899 Other long term (current) drug therapy: Secondary | ICD-10-CM | POA: Insufficient documentation

## 2019-04-09 DIAGNOSIS — J45909 Unspecified asthma, uncomplicated: Secondary | ICD-10-CM | POA: Diagnosis not present

## 2019-04-09 DIAGNOSIS — Z7952 Long term (current) use of systemic steroids: Secondary | ICD-10-CM | POA: Diagnosis not present

## 2019-04-09 DIAGNOSIS — Z20822 Contact with and (suspected) exposure to covid-19: Secondary | ICD-10-CM | POA: Diagnosis not present

## 2019-04-09 DIAGNOSIS — R05 Cough: Secondary | ICD-10-CM | POA: Diagnosis present

## 2019-04-09 NOTE — ED Notes (Signed)
ED Provider at bedside. 

## 2019-04-09 NOTE — ED Notes (Addendum)
States," I want to make sure I don't have COVID, I need a test" Denies fever or cough . Has taken 3 does of prednisone from a old srcipt over the last 3 days.

## 2019-04-09 NOTE — ED Provider Notes (Signed)
[facility] EMERGENCY DEPARTMENT Provider Note   CSN: [id number] Arrival date & time: 04/09/19  1626     History Chief Complaint  Patient presents with  . Cough    Frederick Lyons is a 44 y.o. male who reports a history of asthma otherwise healthy presents today requesting Covid test.  Reports that over the past 1-2 days he has felt under the weather, he describes vague symptoms of feeling slightly more tired than normal.  He reports this resolved without any treatment but is concerned for COVID-19 virus and would like a test today.  He reports that his sister is "immunocompromised".  And he was to be tested for her sake.  Patient reports that he has a history of asthma and is compliant with his home therapies of albuterol.  He reports he has prednisone at home that he has been using over the past few days prophylactically.  He reports that he is feeling well at this time and has no other concerns.  Denies fever/chills, headache, neck pain, chest pain/shortness of breath, cough/hemoptysis, abdominal pain, nausea/vomiting, diarrhea, extremity pain/swelling, color change, or any additional concerns.  HPI     Past Medical History:  Diagnosis Date  . Hallux valgus (acquired)    RIGHT    There are no problems to display for this patient.   Past Surgical History:  Procedure Laterality Date  . BUNIONECTOMY Right 06/24/2013   Procedure: LAPIDUS BUNION;  Surgeon: Larey Dresser, DPM;  Location: [facility];  Service: Podiatry;  Laterality: Right;  . METATARSAL OSTEOTOMY Right 06/24/2013   Procedure: AKIN-OSTEOTOMY;  Surgeon: Larey Dresser, DPM;  Location: [facility];  Service: Podiatry;  Laterality: Right;  . ORIF  LEFT 5TH METACARPAL FX  05-29-2013       No family history on file.  Social History   Tobacco Use  . Smoking status: Never Smoker  . Smokeless tobacco: Never Used  Substance Use Topics  . Alcohol use: Yes    Comment: occ    . Drug use: No    Home Medications Prior to Admission medications   Medication Sig Start Date End Date Taking? Authorizing Provider  albuterol (PROVENTIL HFA;VENTOLIN HFA) 108 (90 Base) MCG/ACT inhaler Inhale 1-2 puffs into the lungs every 6 (six) hours as needed for wheezing or shortness of breath.    [provider]  aspirin EC 81 MG tablet Take 81 mg by mouth every 6 (six) hours as needed (for headache).    [provider]  ibuprofen (ADVIL,MOTRIN) 200 MG tablet Take 800 mg by mouth every 6 (six) hours as needed for headache, mild pain or moderate pain.    [provider]  predniSONE (DELTASONE) 20 MG tablet Take 3 tablets (60 mg total) by mouth daily. 09/15/18   Dione Booze, MD    Allergies    Patient has no known allergies.  Review of Systems   Review of Systems Ten systems are reviewed and are negative for acute change except as noted in the HPI  Physical Exam Updated Vital Signs BP (!) 147/97 (BP Location: Left Arm)   Pulse 82   Temp 99.2 F (37.3 C) (Oral)   Resp 16   Ht 5\' 7"  (1.702 m)   Wt 95.3 kg   SpO2 99%   BMI 32.89 kg/m   Physical Exam Constitutional:      General: He is not in acute distress.    Appearance: Normal appearance. He is well-developed. He is not ill-appearing  or diaphoretic.  HENT:     Head: Normocephalic and atraumatic.     Right Ear: External ear normal.     Left Ear: External ear normal.     Nose: Nose normal.     Mouth/Throat:     Mouth: Mucous membranes are moist.     Pharynx: Oropharynx is clear.  Eyes:     General: Vision grossly intact. Gaze aligned appropriately.     Pupils: Pupils are equal, round, and reactive to light.  Neck:     Trachea: Trachea and phonation normal. No tracheal deviation.  Cardiovascular:     Rate and Rhythm: Normal rate and regular rhythm.     Pulses: Normal pulses.     Heart sounds: Normal heart sounds.  Pulmonary:     Effort: Pulmonary effort is normal. No respiratory  distress.     Breath sounds: Normal breath sounds.  Abdominal:     General: There is no distension.     Palpations: Abdomen is soft.     Tenderness: There is no abdominal tenderness. There is no guarding or rebound.  Musculoskeletal:        General: Normal range of motion.     Cervical back: Normal range of motion.  Skin:    General: Skin is warm and dry.  Neurological:     Mental Status: He is alert.     GCS: GCS eye subscore is 4. GCS verbal subscore is 5. GCS motor subscore is 6.     Comments: Speech is clear and goal oriented, follows commands Major Cranial nerves without deficit, no facial droop Moves extremities without ataxia, coordination intact  Psychiatric:        Behavior: Behavior normal.     ED Results / Procedures / Treatments   Labs (all labs ordered are listed, but only abnormal results are displayed) Labs Reviewed  NOVEL CORONAVIRUS, NAA (HOSP ORDER, SEND-OUT TO REF LAB; TAT 18-24 HRS)    EKG None  Radiology DG Chest Portable 1 View  Result Date: 04/09/2019 CLINICAL DATA:  Cough EXAM: PORTABLE CHEST 1 VIEW COMPARISON:  September 15, 2018 FINDINGS: The main pulmonary artery appears to be dilated. There is no large pleural effusion or pneumothorax. There is no focal infiltrate. The heart size is stable from prior study. IMPRESSION: 1. No acute cardiopulmonary process. 2. Prominent main pulmonary artery which can be seen in patients with elevated pulmonary artery pressures. Electronically Signed   By: Katherine Mantle M.D.   On: 04/09/2019 20:32    Procedures Procedures (including critical care time)  Medications Ordered in ED Medications - No data to display  ED Course  I have reviewed the triage vital signs and the nursing notes.  Pertinent labs & imaging results that were available during my care of the patient were reviewed by me and considered in my medical decision making (see chart for details).    MDM Rules/Calculators/A&P                       44 year old male arrives requesting Covid test.  He describes a vague feeling of tiredness earlier which has resolved.  His main concern is that his sister is apparently immunocompromised and he would like a Covid test.  Patient is an asthmatic denies any shortness of breath, wheezing or chest pain.  He is asymptomatic at this time.  Cranial nerves intact, no meningeal signs, heart regular rate and rhythm, lungs clear to auscultation, abdomen soft nontender without peritoneal signs, neurovascular  intact to all 4 extremities without evidence of DVT.  Vital signs stable no tachycardia or hypoxia on room air.  Will perform outpatient test and screening chest x-ray, patient symptoms seem to have changed since triage. - Chest x-ray:  IMPRESSION:  1. No acute cardiopulmonary process.  2. Prominent main pulmonary artery which can be seen in patients  with elevated pulmonary artery pressures.   - Patient aware that Covid test is pending and will result in 1-2 days, he will check his MyChart account for results.  Continue to quarantine until results are available.  He is aware false-negative test is a possibility and that he should continue quarantining if he is feeling any symptoms until symptom-free x7 days.  Discussed home symptomatic therapies if he develops symptoms.  I have encouraged him to call his primary care provider to schedule follow-up appointment via televisit for this week.  He denies needing refill of any of his home medications.  He has no concerns states understanding of care plan and agreeable for discharge.  Ambulatory discharge without assistance or difficulty.  At this time there does not appear to be any evidence of an acute emergency medical condition and the patient appears stable for discharge with appropriate outpatient follow up. Diagnosis was discussed with patient who verbalizes understanding of care plan and is agreeable to discharge. I have discussed return precautions with  patient who verbalizes understanding of return precautions. Patient encouraged to follow-up with their PCP. All questions answered.  Patient has been discharged in good condition.  Patient's case and imaging discussed with Dr. Billy Fischer who agrees with plan to discharge with follow-up.   Note: Portions of this report may have been transcribed using voice recognition software. Every effort was made to ensure accuracy; however, inadvertent computerized transcription errors may still be present.  Final Clinical Impression(s) / ED Diagnoses Final diagnoses:  Encounter for screening laboratory testing for COVID-19 virus    Rx / DC Orders ED Discharge Orders    None       Gari Crown 04/09/19 2100    Gareth Morgan, MD 04/11/19 2154

## 2019-04-09 NOTE — Discharge Instructions (Addendum)
You have been diagnosed today with encounter for Covid test.  At this time there does not appear to be the presence of an emergent medical condition, however there is always the potential for conditions to change. Please read and follow the below instructions.  Please return to the Emergency Department immediately for any new or worsening symptoms. Please be sure to follow up with your Primary Care Provider within one week regarding your visit today; please call their office to schedule an appointment even if you are feeling better for a follow-up visit. Your Covid test is pending, your results will be available on your MyChart account in 1-2 days.  Please continue to quarantine to avoid spread of any potential virus.  There is always a chance of a false negative test so if you develop any symptoms please continue to quarantine until symptom-free x10 days. Your chest x-ray showed prominent pulmonary arteries which can indicate high pulmonary artery pressures.  Please discuss this with your primary care provider at your visit this week.  Get help right away if: You have trouble breathing. You have pain or pressure in your chest. You have confusion. You have bluish lips and fingernails. You have difficulty waking from sleep. You have any new/concerning or worsening of symptoms  Please read the additional information packets attached to your discharge summary.  Do not take your medicine if  develop an itchy rash, swelling in your mouth or lips, or difficulty breathing; call 911 and seek immediate emergency medical attention if this occurs.  Note: Portions of this text may have been transcribed using voice recognition software. Every effort was made to ensure accuracy; however, inadvertent computerized transcription errors may still be present.

## 2019-04-09 NOTE — ED Triage Notes (Signed)
Pt c/o flu like sx x 1 week-NAD-steady gait 

## 2019-04-12 LAB — NOVEL CORONAVIRUS, NAA (HOSP ORDER, SEND-OUT TO REF LAB; TAT 18-24 HRS): SARS-CoV-2, NAA: NOT DETECTED

## 2019-06-17 ENCOUNTER — Other Ambulatory Visit: Payer: Self-pay

## 2019-06-17 ENCOUNTER — Encounter (HOSPITAL_COMMUNITY): Payer: Self-pay

## 2019-06-17 ENCOUNTER — Emergency Department (HOSPITAL_COMMUNITY)
Admission: EM | Admit: 2019-06-17 | Discharge: 2019-06-17 | Disposition: A | Discharge status: Home / Self Care | Payer: Self-pay | Attending: Emergency Medicine | Admitting: Emergency Medicine

## 2019-06-17 DIAGNOSIS — Z79899 Other long term (current) drug therapy: Secondary | ICD-10-CM | POA: Insufficient documentation

## 2019-06-17 DIAGNOSIS — R030 Elevated blood-pressure reading, without diagnosis of hypertension: Secondary | ICD-10-CM | POA: Insufficient documentation

## 2019-06-17 NOTE — ED Notes (Signed)
Patient reports he did not take hi BP medication last night, but took the medication before coming to ED this morning. Patient has no other symptoms.

## 2019-06-17 NOTE — ED Provider Notes (Signed)
[name]-EMERGENCY DEPT Provider Note   CSN: [id number] Arrival date & time: 06/17/19  0086     History Chief Complaint  Patient presents with  . Hypertension    Frederick Lyons is a 44 y.o. male.  Patient presents to the emergency department with a chief complaint of elevated blood pressure reading.  He states that he woke up to use the bathroom today and felt a little jittery.  He took his blood pressure, and noted it to be elevated.  He states that he did not take his blood pressure medication today.  He also reports that he was out drinking last night.  He denies any headache, numbness, weakness, tingling, chest pain, shortness of breath.  He only recently began taking blood pressure medication about 2 weeks ago.  He states that up until today his blood pressures have been in the 120s systolic.  He denies any other associated symptoms.  The history is provided by the patient. No language interpreter was used.       Past Medical History:  Diagnosis Date  . Hallux valgus (acquired)    RIGHT    There are no problems to display for this patient.   Past Surgical History:  Procedure Laterality Date  . BUNIONECTOMY Right 06/24/2013   Procedure: LAPIDUS BUNION;  Surgeon: Larey Dresser, DPM;  Location: [facility]  Service: Podiatry;  Laterality: Right;  . METATARSAL OSTEOTOMY Right 06/24/2013   Procedure: AKIN-OSTEOTOMY;  Surgeon: Larey Dresser, DPM;  Location: [facility];  Service: Podiatry;  Laterality: Right;  . ORIF  LEFT 5TH METACARPAL FX  05-29-2013       History reviewed. No pertinent family history.  Social History   Tobacco Use  . Smoking status: Never Smoker  . Smokeless tobacco: Never Used  Substance Use Topics  . Alcohol use: Yes    Comment: occ  . Drug use: No    Home Medications Prior to Admission medications   Medication Sig Start Date End Date Taking? Authorizing Provider  albuterol  (PROVENTIL HFA;VENTOLIN HFA) 108 (90 Base) MCG/ACT inhaler Inhale 1-2 puffs into the lungs every 6 (six) hours as needed for wheezing or shortness of breath.    [provider]  aspirin EC 81 MG tablet Take 81 mg by mouth every 6 (six) hours as needed (for headache).    [provider]  ibuprofen (ADVIL,MOTRIN) 200 MG tablet Take 800 mg by mouth every 6 (six) hours as needed for headache, mild pain or moderate pain.    [provider]  predniSONE (DELTASONE) 20 MG tablet Take 3 tablets (60 mg total) by mouth daily. 09/15/18   Dione Booze, MD    Allergies    Patient has no known allergies.  Review of Systems   Review of Systems  All other systems reviewed and are negative.   Physical Exam Updated Vital Signs BP (!) 159/94   Pulse (!) 106   Temp 98.4 F (36.9 C) (Oral)   Resp 20   Ht 5\' 7"  (1.702 m)   Wt 99.8 kg   SpO2 96%   BMI 34.46 kg/m   Physical Exam Vitals and nursing note reviewed.  Constitutional:      Appearance: He is well-developed.  HENT:     Head: Normocephalic and atraumatic.  Eyes:     Conjunctiva/sclera: Conjunctivae normal.  Cardiovascular:     Rate and Rhythm: Normal rate and regular rhythm.     Heart sounds: No murmur.  Pulmonary:     Effort: Pulmonary effort is normal. No respiratory distress.     Breath sounds: Normal breath sounds.  Abdominal:     Palpations: Abdomen is soft.     Tenderness: There is no abdominal tenderness.  Musculoskeletal:        General: Normal range of motion.     Cervical back: Neck supple.  Skin:    General: Skin is warm and dry.  Neurological:     Mental Status: He is alert and oriented to person, place, and time.  Psychiatric:        Mood and Affect: Mood normal.        Behavior: Behavior normal.     ED Results / Procedures / Treatments   Labs (all labs ordered are listed, but only abnormal results are displayed) Labs Reviewed - No data to display  EKG None  Radiology No results  found.  Procedures Procedures (including critical care time)  Medications Ordered in ED Medications - No data to display  ED Course  I have reviewed the triage vital signs and the nursing notes.  Pertinent labs & imaging results that were available during my care of the patient were reviewed by me and considered in my medical decision making (see chart for details).    MDM Rules/Calculators/A&P                      Patient with elevated blood pressure reading tonight.  He did not take his blood pressure medication today.  States that he awoke to use the bathroom and felt a little jittery and noticed that his blood pressure was high.  He has not had any chest pain or shortness of breath.  Denies any headache, numbness, weakness, and tingling.  He did take his blood pressure medication prior to coming to the emergency department.  I have encouraged him to continue to be compliant with his blood pressure medication and to follow-up closely with his primary care doctor.  At this time, based on patient being asymptomatic and blood pressure is improving, I do not think that the patient needs any additional work-up or intervention in the emergency department.  I find him stable for discharge. Final Clinical Impression(s) / ED Diagnoses Final diagnoses:  Elevated blood pressure reading    Rx / DC Orders ED Discharge Orders    None       Montine Circle, PA-C 06/17/19 [place], April, MD 06/17/19 0440

## 2019-06-17 NOTE — Discharge Instructions (Addendum)
Return to the ER for headache, chest pain, or shortness of breath or for any other symptoms you find concerning.

## 2019-06-17 NOTE — ED Triage Notes (Signed)
Pt reports that he woke up to use the restroom and felt "a little jittery" so he took his BP and states that it was 162/99. States that he just started BP medication. Denies pain or numbness.

## 2021-04-21 IMAGING — DX DG CHEST 1V PORT
1 series · 1 of 1 positions shown · non-contrast
Comparison: September 15, 2018

CLINICAL DATA: Cough

EXAM:
PORTABLE CHEST 1 VIEW

[chest ap]
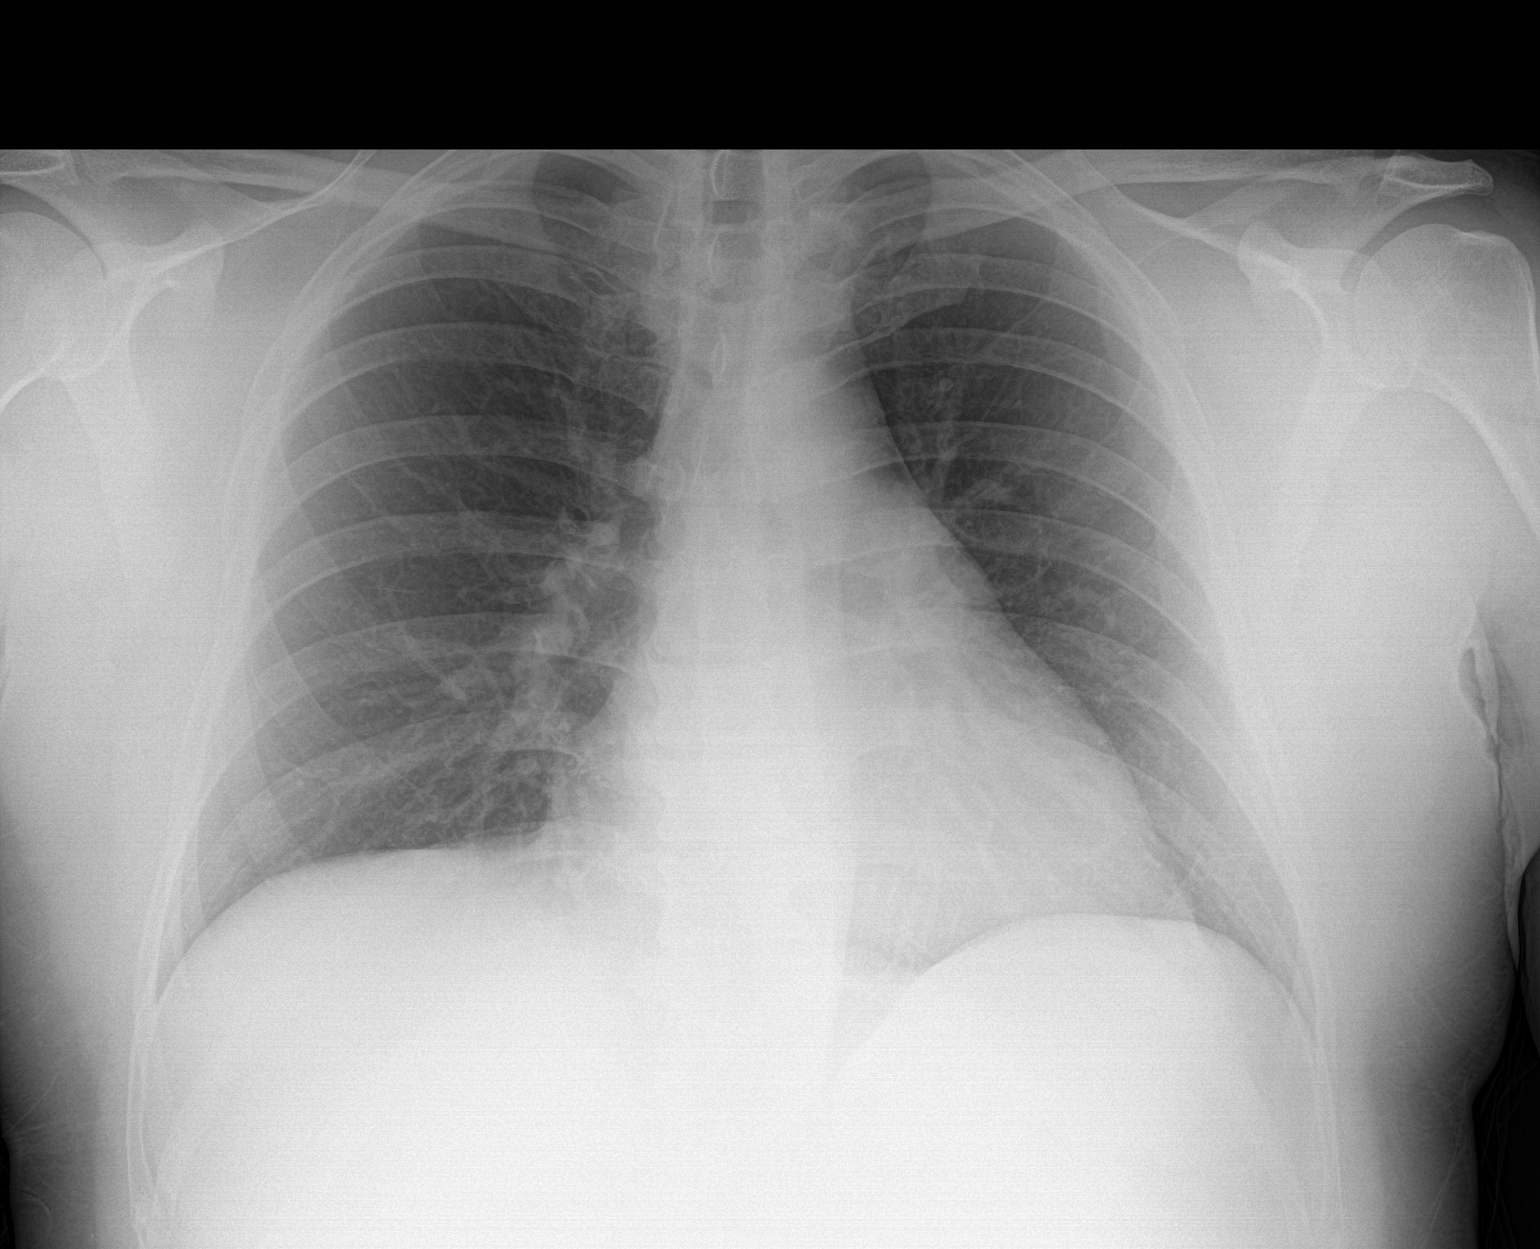

[1 of 1 positions shown; findings below may reference images not displayed]

FINDINGS: The main pulmonary artery appears to be dilated. There is no large
pleural effusion or pneumothorax. There is no focal infiltrate. The
heart size is stable from prior study.
IMPRESSION: 1. No acute cardiopulmonary process.
2. Prominent main pulmonary artery which can be seen in patients
with elevated pulmonary artery pressures.

## 2021-10-27 ENCOUNTER — Emergency Department (HOSPITAL_BASED_OUTPATIENT_CLINIC_OR_DEPARTMENT_OTHER)
Admission: EM | Admit: 2021-10-27 | Discharge: 2021-10-27 | Disposition: A | Discharge status: Home / Self Care | Payer: No Typology Code available for payment source | Attending: Emergency Medicine | Admitting: Emergency Medicine

## 2021-10-27 ENCOUNTER — Encounter (HOSPITAL_BASED_OUTPATIENT_CLINIC_OR_DEPARTMENT_OTHER): Payer: Self-pay | Admitting: Emergency Medicine

## 2021-10-27 DIAGNOSIS — R531 Weakness: Secondary | ICD-10-CM | POA: Diagnosis present

## 2021-10-27 DIAGNOSIS — J45909 Unspecified asthma, uncomplicated: Secondary | ICD-10-CM | POA: Diagnosis not present

## 2021-10-27 DIAGNOSIS — T676XXA Heat fatigue, transient, initial encounter: Secondary | ICD-10-CM | POA: Diagnosis not present

## 2021-10-27 LAB — COMPREHENSIVE METABOLIC PANEL
ALT: 27 U/L (ref 0–44)
AST: 33 U/L (ref 15–41)
Albumin: 4.3 g/dL (ref 3.5–5.0)
Alkaline Phosphatase: 56 U/L (ref 38–126)
Anion gap: 6 (ref 5–15)
BUN: 14 mg/dL (ref 6–20)
CO2: 24 mmol/L (ref 22–32)
Calcium: 8.9 mg/dL (ref 8.9–10.3)
Chloride: 108 mmol/L (ref 98–111)
Creatinine, Ser: 1.01 mg/dL (ref 0.61–1.24)
GFR, Estimated: >60 mL/min (ref >60)
Glucose, Bld: 100 mg/dL — ABNORMAL HIGH (ref 70–99)
Potassium: 3.7 mmol/L (ref 3.5–5.1)
Sodium: 138 mmol/L (ref 135–145)
Total Bilirubin: 0.7 mg/dL (ref 0.3–1.2)
Total Protein: 7 g/dL (ref 6.5–8.1)

## 2021-10-27 LAB — CBC WITH DIFFERENTIAL/PLATELET
Abs Immature Granulocytes: 0.02 10*3/uL (ref 0.00–0.07)
Basophils Absolute: 0 10*3/uL (ref 0.0–0.1)
Basophils Relative: 0 %
Eosinophils Absolute: 0.1 10*3/uL (ref 0.0–0.5)
Eosinophils Relative: 1 %
HCT: 37.5 % — ABNORMAL LOW (ref 39.0–52.0)
Hemoglobin: 12.2 g/dL — ABNORMAL LOW (ref 13.0–17.0)
Immature Granulocytes: 0 %
Lymphocytes Relative: 30 %
Lymphs Abs: 2 10*3/uL (ref 0.7–4.0)
MCH: 30.7 pg (ref 26.0–34.0)
MCHC: 32.5 g/dL (ref 30.0–36.0)
MCV: 94.5 fL (ref 80.0–100.0)
Monocytes Absolute: 0.4 10*3/uL (ref 0.1–1.0)
Monocytes Relative: 7 %
Neutro Abs: 4.2 10*3/uL (ref 1.7–7.7)
Neutrophils Relative %: 62 %
Platelets: 195 10*3/uL (ref 150–400)
RBC: 3.97 MIL/uL — ABNORMAL LOW (ref 4.22–5.81)
RDW: 11.8 % (ref 11.5–15.5)
WBC: 6.8 10*3/uL (ref 4.0–10.5)
nRBC: 0 % (ref 0.0–0.2)

## 2021-10-27 MED ORDER — SODIUM CHLORIDE 0.9 % IV BOLUS
1000.0000 mL | Freq: Once | INTRAVENOUS | Status: AC
  Administered 2021-10-27: 1000 mL via INTRAVENOUS

## 2021-10-27 NOTE — ED Triage Notes (Signed)
Pt reports he feels dehydrated/general weakness; works outside for 10-12 hrs; sts he drinks water and Gatorade/Liquid IV

## 2021-10-27 NOTE — Discharge Instructions (Addendum)
You were seen in the emergency room after presenting with fatigue after working outside in the heat today.  We checked your blood levels and electrolytes and found that there was no abnormalities that would cause your fatigue.  We gave you 1 L of IV fluids which improved your fatigue in the emergency room.  Please continue to stay hydrated especially when working outside.  Refer to the attached instructions on preventing heat exhaustion and signs of heat exhaustion and heat stroke for further information.  If you experience any of the symptoms highlighted in the heat exhaustion and heat stroke packets please return to the emergency room.  Call your PCP to schedule follow-up.

## 2021-10-27 NOTE — ED Provider Notes (Signed)
[facility] EMERGENCY DEPARTMENT Provider Note   CSN: [id number] Arrival date & time: 10/27/21  1751     History  Chief Complaint  Patient presents with   Weakness    Frederick Lyons is a 46 y.o. male.  Frederick Lyons is a 46 year old male with past medical history of asthma who presents with complaints of fatigue after working outside in the sun today.  He states he had an episode of brief lightheadedness but denied any syncope or falls.  He did use his albuterol rescue inhaler today and has used it about 4 times over the past 2 weeks which is slightly more than he was previously but he contributes this to the recent heat.  He has not had any obvious asthma exacerbations and denies any bouts of shortness of breath or cough.  He has come to the emergency room with complaints like this in the past and been treated with IV fluids relief.  He denies any other symptoms including chest pain, shortness of breath, nausea, vomiting, headache, dizziness, concentrated urine, diarrhea, decreased oral intake, or recent change in his diet.  He does intermittent fast 16 hours a day but he has been doing this for over 6 months.   Weakness Associated symptoms: no abdominal pain, no chest pain, no cough, no diarrhea, no dizziness, no dysuria, no fever, no nausea, no shortness of breath and no vomiting        Home Medications Prior to Admission medications   Medication Sig Start Date End Date Taking? Authorizing Provider  albuterol (PROVENTIL HFA;VENTOLIN HFA) 108 (90 Base) MCG/ACT inhaler Inhale 1-2 puffs into the lungs every 6 (six) hours as needed for wheezing or shortness of breath.    [provider]  aspirin EC 81 MG tablet Take 81 mg by mouth every 6 (six) hours as needed (for headache).    [provider]  ibuprofen (ADVIL,MOTRIN) 200 MG tablet Take 800 mg by mouth every 6 (six) hours as needed for headache, mild pain or moderate pain.    [provider]   predniSONE (DELTASONE) 20 MG tablet Take 3 tablets (60 mg total) by mouth daily. 09/15/18   Dione Booze, MD      Allergies    Patient has no known allergies.    Review of Systems   Review of Systems  Constitutional:  Positive for fatigue. Negative for chills and fever.  Eyes:  Negative for visual disturbance.  Respiratory:  Negative for cough, shortness of breath and wheezing.   Cardiovascular:  Negative for chest pain and palpitations.  Gastrointestinal:  Negative for abdominal pain, blood in stool, constipation, diarrhea, nausea and vomiting.  Genitourinary:  Negative for dysuria and hematuria.  Neurological:  Positive for light-headedness. Negative for dizziness, syncope, facial asymmetry, weakness and numbness.    Physical Exam Updated Vital Signs BP 122/73   Pulse 69   Temp 98.4 F (36.9 C) (Oral)   Resp 16   Ht 5\' 7"  (1.702 m)   Wt 83.9 kg   SpO2 98%   BMI 28.98 kg/m  Physical Exam Vitals reviewed.  Constitutional:      General: He is not in acute distress.    Appearance: Normal appearance.  Eyes:     Conjunctiva/sclera: Conjunctivae normal.  Cardiovascular:     Rate and Rhythm: Normal rate and regular rhythm.  Pulmonary:     Effort: Pulmonary effort is normal.     Breath sounds: Wheezing (Mild expiratory wheezes right greater than left) present.  Abdominal:     General: Bowel sounds are normal.     Palpations: Abdomen is soft.     Tenderness: There is no abdominal tenderness.  Musculoskeletal:     Right lower leg: No edema.     Left lower leg: No edema.  Skin:    General: Skin is warm and dry.  Neurological:     General: No focal deficit present.     Mental Status: He is alert and oriented to person, place, and time.     Sensory: No sensory deficit.     Motor: No weakness.     ED Results / Procedures / Treatments   Labs (all labs ordered are listed, but only abnormal results are displayed) Labs Reviewed  CBC WITH DIFFERENTIAL/PLATELET - Abnormal;  Notable for the following components:      Result Value   RBC 3.97 (*)    Hemoglobin 12.2 (*)    HCT 37.5 (*)    All other components within normal limits  COMPREHENSIVE METABOLIC PANEL - Abnormal; Notable for the following components:   Glucose, Bld 100 (*)    All other components within normal limits    EKG None  Radiology No results found.  Procedures Procedures    Medications Ordered in ED Medications  sodium chloride 0.9 % bolus 1,000 mL (1,000 mLs Intravenous New Bag/Given 10/27/21 2023)    ED Course/ Medical Decision Making/ A&P                           Medical Decision Making Frederick Lyons is a 46 year old male with a past medical history of asthma who presents with dehydration.  He has no signs of heat exhaustion or heat stroke.  He has no concerning signs or symptoms for a cardiac or pulmonary cause of his fatigue.  We checked a CMP and CBC which were unremarkable and treated him with 1 L of normal saline IV.  He responded well and was discharged in stable condition with all questions answered.  Problems Addressed: Transient heat fatigue, initial encounter: acute illness or injury  Amount and/or Complexity of Data Reviewed Labs: ordered. Decision-making details documented in ED Course.          Final Clinical Impression(s) / ED Diagnoses Final diagnoses:  Transient heat fatigue, initial encounter    Rx / DC Orders ED Discharge Orders     None         Rocky Morel, DO 10/27/21 2111    Milagros Loll, MD 10/28/21 [phone]

## 2021-11-07 ENCOUNTER — Other Ambulatory Visit: Payer: Self-pay

## 2021-11-07 ENCOUNTER — Encounter (HOSPITAL_BASED_OUTPATIENT_CLINIC_OR_DEPARTMENT_OTHER): Payer: Self-pay | Admitting: Urology

## 2021-11-07 ENCOUNTER — Emergency Department (HOSPITAL_BASED_OUTPATIENT_CLINIC_OR_DEPARTMENT_OTHER)
Admission: EM | Admit: 2021-11-07 | Discharge: 2021-11-07 | Disposition: A | Discharge status: Home / Self Care | Payer: No Typology Code available for payment source | Attending: Emergency Medicine | Admitting: Emergency Medicine

## 2021-11-07 DIAGNOSIS — H579 Unspecified disorder of eye and adnexa: Secondary | ICD-10-CM | POA: Insufficient documentation

## 2021-11-07 DIAGNOSIS — Z7982 Long term (current) use of aspirin: Secondary | ICD-10-CM | POA: Diagnosis not present

## 2021-11-07 NOTE — ED Notes (Signed)
Discharge instructions reviewed with patient. Patient verbalizes understanding, no further questions at this time/ follow up information provided. No acute distress noted at time of departure.  

## 2021-11-07 NOTE — ED Triage Notes (Signed)
Pt states woke from sleep approx 20 min PTA and left eye was twitching and left side of mouth twitched No twitching noted at this time

## 2021-11-07 NOTE — ED Provider Notes (Signed)
[facility] EMERGENCY DEPARTMENT Provider Note   CSN: [id number] Arrival date & time: 11/07/21  1543     History  Chief Complaint  Patient presents with   Eye Problem    Frederick Lyons is a 46 y.o. male.  Patient is here after developing some twitching of his left eye/face.  This has resolved.  Woke up from a nap and started.  Lasted a few minutes.  Increased stress recently.  Increase caffeine today. Nothing makes it worse orbetter. No signifcant medical hiotyr. No weakness or numbess or vision loss. No chest pain or SOB.   The history is provided by the patient.       Home Medications Prior to Admission medications   Medication Sig Start Date End Date Taking? Authorizing Provider  albuterol (PROVENTIL HFA;VENTOLIN HFA) 108 (90 Base) MCG/ACT inhaler Inhale 1-2 puffs into the lungs every 6 (six) hours as needed for wheezing or shortness of breath.    [provider]  aspirin EC 81 MG tablet Take 81 mg by mouth every 6 (six) hours as needed (for headache).    [provider]  ibuprofen (ADVIL,MOTRIN) 200 MG tablet Take 800 mg by mouth every 6 (six) hours as needed for headache, mild pain or moderate pain.    [provider]  predniSONE (DELTASONE) 20 MG tablet Take 3 tablets (60 mg total) by mouth daily. 09/15/18   Dione Booze, MD      Allergies    Patient has no known allergies.    Review of Systems   Review of Systems  Physical Exam Updated Vital Signs BP 131/80 (BP Location: Left Arm)   Pulse 87   Temp 98.2 F (36.8 C) (Oral)   Resp 16   Ht 5\' 7"  (1.702 m)   Wt 83.9 kg   SpO2 99%   BMI 28.98 kg/m  Physical Exam Vitals and nursing note reviewed.  Constitutional:      General: He is not in acute distress.    Appearance: He is well-developed.  HENT:     Head: Normocephalic and atraumatic.  Eyes:     Extraocular Movements: Extraocular movements intact.     Conjunctiva/sclera: Conjunctivae normal.     Pupils: Pupils are  equal, round, and reactive to light.  Cardiovascular:     Rate and Rhythm: Normal rate and regular rhythm.     Heart sounds: No murmur heard. Pulmonary:     Effort: Pulmonary effort is normal. No respiratory distress.     Breath sounds: Normal breath sounds.  Abdominal:     Palpations: Abdomen is soft.     Tenderness: There is no abdominal tenderness.  Musculoskeletal:        General: No swelling.     Cervical back: Neck supple.  Skin:    General: Skin is warm and dry.     Capillary Refill: Capillary refill takes less than 2 seconds.  Neurological:     General: No focal deficit present.     Mental Status: He is alert and oriented to person, place, and time.     Cranial Nerves: No cranial nerve deficit.     Sensory: No sensory deficit.     Motor: No weakness.     Coordination: Coordination normal.  Psychiatric:        Mood and Affect: Mood normal.     ED Results / Procedures / Treatments   Labs (all labs ordered are listed, but only abnormal results are displayed) Labs Reviewed - No  data to display  EKG None  Radiology No results found.  Procedures Procedures    Medications Ordered in ED Medications - No data to display  ED Course/ Medical Decision Making/ A&P                           Medical Decision Making  Frederick Lyons is here for brief episode of left eyelid twitching and face. Resolved after a few minutes. Increased caffeiene today and more stress recently and not sleeping a lot. No active symptoms. No stroke symptoms. Well appearing. Normal neuro and eye exam. Normal vitals. Overall ddx is likely twitxhing from combination of poor sleep and extra caffeine use today. Given resassurance and discharged. Recommend sleep and oral hydration.  This chart was dictated using voice recognition software.  Despite best efforts to proofread,  errors can occur which can change the documentation meaning.         Final Clinical Impression(s) / ED  Diagnoses Final diagnoses:  Eye problem    Rx / DC Orders ED Discharge Orders     None         Virgina Norfolk, DO 11/07/21 1606

## 2021-11-07 NOTE — Discharge Instructions (Signed)
Overall suspect twitching was from fatigue, excessive caffeine use.  Work on getting plenty of sleep at night.  Avoid caffeine for the next day or 2.  Get plenty of fluids.

## 2021-12-04 ENCOUNTER — Emergency Department (HOSPITAL_BASED_OUTPATIENT_CLINIC_OR_DEPARTMENT_OTHER)
Admission: EM | Admit: 2021-12-04 | Discharge: 2021-12-04 | Disposition: A | Discharge status: Home / Self Care | Payer: No Typology Code available for payment source | Attending: Emergency Medicine | Admitting: Emergency Medicine

## 2021-12-04 ENCOUNTER — Encounter (HOSPITAL_BASED_OUTPATIENT_CLINIC_OR_DEPARTMENT_OTHER): Payer: Self-pay | Admitting: Emergency Medicine

## 2021-12-04 DIAGNOSIS — R42 Dizziness and giddiness: Secondary | ICD-10-CM | POA: Diagnosis present

## 2021-12-04 LAB — BASIC METABOLIC PANEL
Anion gap: 6 (ref 5–15)
BUN: 13 mg/dL (ref 6–20)
CO2: 28 mmol/L (ref 22–32)
Calcium: 9.1 mg/dL (ref 8.9–10.3)
Chloride: 106 mmol/L (ref 98–111)
Creatinine, Ser: 0.97 mg/dL (ref 0.61–1.24)
GFR, Estimated: >60 mL/min (ref >60)
Glucose, Bld: 106 mg/dL — ABNORMAL HIGH (ref 70–99)
Potassium: 4.3 mmol/L (ref 3.5–5.1)
Sodium: 140 mmol/L (ref 135–145)

## 2021-12-04 LAB — CBC
HCT: 41.8 % (ref 39.0–52.0)
Hemoglobin: 12.9 g/dL — ABNORMAL LOW (ref 13.0–17.0)
MCH: 29.6 pg (ref 26.0–34.0)
MCHC: 30.9 g/dL (ref 30.0–36.0)
MCV: 95.9 fL (ref 80.0–100.0)
Platelets: 212 10*3/uL (ref 150–400)
RBC: 4.36 MIL/uL (ref 4.22–5.81)
RDW: 12.1 % (ref 11.5–15.5)
WBC: 6.8 10*3/uL (ref 4.0–10.5)
nRBC: 0 % (ref 0.0–0.2)

## 2021-12-04 LAB — URINALYSIS, ROUTINE W REFLEX MICROSCOPIC
Bilirubin Urine: NEGATIVE
Glucose, UA: NEGATIVE mg/dL
Hgb urine dipstick: NEGATIVE
Ketones, ur: NEGATIVE mg/dL
Leukocytes,Ua: NEGATIVE
Nitrite: NEGATIVE
Protein, ur: NEGATIVE mg/dL
Specific Gravity, Urine: 1.015 (ref 1.005–1.030)
pH: 5.5 (ref 5.0–8.0)

## 2021-12-04 LAB — CBG MONITORING, ED: Glucose-Capillary: 102 mg/dL — ABNORMAL HIGH (ref 70–99)

## 2021-12-04 NOTE — Discharge Instructions (Signed)
Increase sleep quality and duration.  Increase hydration.  When getting up from the bed to sit on the edge of the bed for approximately 60 seconds prior to standing.  Stand next to the bed for 60 seconds before proceeding to walk.  Follow-up with your primary care physician as needed otherwise if symptoms return and you synopsized (pass out) return to the emergency department evaluation.

## 2021-12-04 NOTE — ED Notes (Signed)
IV Removed  

## 2021-12-04 NOTE — ED Provider Notes (Signed)
[facility] EMERGENCY DEPARTMENT Provider Note   CSN: [id number] Arrival date & time: 12/04/21  2423     History  Chief Complaint  Patient presents with   Dizziness    Frederick Lyons is a 46 y.o. male.  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  Patient reports he got dizzy while he was trying to get out of bed, lasted for a minute, and then resolved spontaneously.  Patient attributed dizziness to lightheadedness without any attributing symptoms or reoccurrence. Denies chest pain, shortness of breath, palpitation, nausea or sweating with the dizziness. Denies spinning of the room. Patient denies similar symptoms in the past.  Patient got concerned and wanted to get checked out.  Patient was diagnosed with sinus infection 2 weeks ago and currently taking doxycycline 100 mg twice daily and guaifenesin 600 mg twice daily. Patient reports he still has productive cough with clear-colored phlegm.  Denies facial pain or pressure.  Patient reports he smokes marijuana 2-3 times a week.  Last time he smoked was 2 days ago. Patient is asymptomatic upon arrival to ED. he is comfortably communicating with complete sentences.  No acute visible distress.   The history is provided by the patient. No language interpreter was used.  Dizziness Associated symptoms: no chest pain, no headaches, no palpitations, no shortness of breath, no tinnitus, no vomiting and no weakness        Home Medications Prior to Admission medications   Medication Sig Start Date End Date Taking? Authorizing Provider  albuterol (PROVENTIL HFA;VENTOLIN HFA) 108 (90 Base) MCG/ACT inhaler Inhale 1-2 puffs into the lungs every 6 (six) hours as needed for wheezing or shortness of breath.    [provider]  aspirin EC 81 MG tablet Take 81 mg by mouth every 6 (six) hours as needed (for headache).    [provider]   ibuprofen (ADVIL,MOTRIN) 200 MG tablet Take 800 mg by mouth every 6 (six) hours as needed for headache, mild pain or moderate pain.    [provider]  predniSONE (DELTASONE) 20 MG tablet Take 3 tablets (60 mg total) by mouth daily. 09/15/18   Dione Booze, MD      Allergies    Patient has no known allergies.    Review of Systems   Review of Systems  Constitutional: Negative.  Negative for chills, fatigue and fever.  HENT:  Positive for congestion. Negative for ear pain, rhinorrhea, sinus pressure, sinus pain, sore throat and tinnitus.   Eyes:  Negative for photophobia, pain, redness and visual disturbance.  Respiratory:  Positive for cough (Mild productive cough with clear-colored phlegm.  Lungs clear to auscultation.  No wheezing, Rales, rhonchi appreciated to auscultation). Negative for shortness of breath.   Cardiovascular:  Negative for chest pain and palpitations.  Gastrointestinal:  Negative for abdominal pain and vomiting.  Genitourinary:  Negative for dysuria.  Musculoskeletal:  Negative for arthralgias and back pain.  Skin:  Negative for color change and rash.  Neurological:  Positive for dizziness (Patient denies dizziness at the time of exam.  No focal neurological deficits.  Strength equal bilateral upper and lower extremities.). Negative for tremors, seizures, syncope, facial asymmetry, speech difficulty, weakness, light-headedness, numbness and headaches.  Psychiatric/Behavioral: Negative.         Patient calm and cooperative  All other systems reviewed and are negative.   Physical Exam Updated Vital Signs BP 122/82   Pulse 64   Temp  98.8 F (37.1 C) (Oral)   Resp 20   Ht 5\' 7"  (1.702 m)   Wt 88.5 kg   SpO2 99%   BMI 30.54 kg/m  Physical Exam Vitals and nursing note reviewed.  Constitutional:      General: He is not in acute distress.    Appearance: Normal appearance. He is well-developed.  HENT:     Head: Normocephalic and atraumatic.     Right Ear:  Tympanic membrane and ear canal normal.     Left Ear: Tympanic membrane and ear canal normal.     Nose: No congestion or rhinorrhea.     Mouth/Throat:     Mouth: Mucous membranes are moist.     Pharynx: No oropharyngeal exudate or posterior oropharyngeal erythema.  Eyes:     General: Lids are normal. No visual field deficit.    Extraocular Movements: Extraocular movements intact.     Conjunctiva/sclera: Conjunctivae normal.     Pupils: Pupils are equal, round, and reactive to light.  Cardiovascular:     Rate and Rhythm: Normal rate and regular rhythm.     Heart sounds: No murmur heard. Pulmonary:     Effort: Pulmonary effort is normal. No respiratory distress.     Breath sounds: Normal breath sounds. No wheezing, rhonchi or rales.  Abdominal:     Palpations: Abdomen is soft.     Tenderness: There is no abdominal tenderness.  Musculoskeletal:        General: No swelling.     Cervical back: Normal range of motion and neck supple.  Skin:    General: Skin is warm and dry.     Capillary Refill: Capillary refill takes less than 2 seconds.  Neurological:     General: No focal deficit present.     Mental Status: He is alert and oriented to person, place, and time. Mental status is at baseline.     GCS: GCS eye subscore is 4. GCS verbal subscore is 5. GCS motor subscore is 6.     Sensory: Sensation is intact. No sensory deficit.     Motor: Motor function is intact. No weakness.     Gait: Gait is intact.  Psychiatric:        Mood and Affect: Mood normal.     ED Results / Procedures / Treatments   Labs (all labs ordered are listed, but only abnormal results are displayed) Labs Reviewed  BASIC METABOLIC PANEL - Abnormal; Notable for the following components:      Result Value   Glucose, Bld 106 (*)    All other components within normal limits  CBC - Abnormal; Notable for the following components:   Hemoglobin 12.9 (*)    All other components within normal limits  CBG MONITORING,  ED - Abnormal; Notable for the following components:   Glucose-Capillary 102 (*)    All other components within normal limits  URINALYSIS, ROUTINE W REFLEX MICROSCOPIC    EKG EKG Interpretation  Date/Time:  Tuesday December 04 2021 06:33:33 EDT Ventricular Rate:  78 PR Interval:  168 QRS Duration: 91 QT Interval:  371 QTC Calculation: 423 R Axis:   37 Text Interpretation: Sinus rhythm ST elev, probable normal early repol pattern No previous ECGs available Confirmed by [id number] (Paula Libra) on 12/04/2021 6:53:37 AM  Radiology No results found.  Procedures Procedures    Medications Ordered in ED Medications - No data to display  ED Course/ Medical Decision Making/ A&P  Medical Decision Making 46 year old male presented with dizziness upon standing lasting only seconds-1 minute.  Patient is stable without acute visible distress.  Vital signs stable.  No focal neurological deficits.   I have personally reviewed the patient's EKG and laboratory results.   -Doubt cardiac etiology. Stable EKG and electrolytes. No chest pain or sob. Stable orthostatic blood pressure.  I do not anticipate neurological reason for patient's dizziness. No slurred speech, facial asymmetry, sensation, or motor dysfunction. -Dizziness is not described as rotatory. Normal gait. Symptoms were momentary without reoccurrence.  -No anemia -No hypotension -No resp distress. No hypoxia -No hypoglycemia  Patient in no distress and overall condition improved here in the ED. Detailed discussions were had with the patient regarding current findings, and need for close f/u with PCP or on call doctor. The patient has been instructed to return immediately if the symptoms worsen in any way for re-evaluation. Patient verbalized understanding and is in agreement with current care plan. All questions answered prior to discharge.     Amount and/or Complexity of Data Reviewed External Data  Reviewed: labs, ECG and notes. Labs: ordered.  CBC and CMP within normal limits. Lab results were discussed with the patient. Patient was encouraged to increase hydration.          Final Clinical Impression(s) / ED Diagnoses Final diagnoses:  Postural dizziness    Rx / DC Orders ED Discharge Orders     None         Fausto Sampedro, MD 12/04/21 1528    Edwin Dada P, DO 12/07/21 1617

## 2021-12-04 NOTE — ED Triage Notes (Signed)
Pt states got up this morning and got dizzy.

## 2021-12-04 NOTE — ED Notes (Signed)
Pt lying flat 

## 2021-12-18 ENCOUNTER — Emergency Department (HOSPITAL_BASED_OUTPATIENT_CLINIC_OR_DEPARTMENT_OTHER)
Admission: EM | Admit: 2021-12-18 | Discharge: 2021-12-18 | Disposition: A | Discharge status: Home / Self Care | Payer: No Typology Code available for payment source | Attending: Emergency Medicine | Admitting: Emergency Medicine

## 2021-12-18 ENCOUNTER — Encounter (HOSPITAL_BASED_OUTPATIENT_CLINIC_OR_DEPARTMENT_OTHER): Payer: Self-pay

## 2021-12-18 ENCOUNTER — Other Ambulatory Visit: Payer: Self-pay

## 2021-12-18 DIAGNOSIS — S29011A Strain of muscle and tendon of front wall of thorax, initial encounter: Secondary | ICD-10-CM | POA: Insufficient documentation

## 2021-12-18 DIAGNOSIS — W28XXXA Contact with powered lawn mower, initial encounter: Secondary | ICD-10-CM | POA: Diagnosis not present

## 2021-12-18 DIAGNOSIS — Y9389 Activity, other specified: Secondary | ICD-10-CM | POA: Diagnosis not present

## 2021-12-18 DIAGNOSIS — S29001A Unspecified injury of muscle and tendon of front wall of thorax, initial encounter: Secondary | ICD-10-CM | POA: Diagnosis present

## 2021-12-18 DIAGNOSIS — T148XXA Other injury of unspecified body region, initial encounter: Secondary | ICD-10-CM

## 2021-12-18 MED ORDER — NAPROXEN 375 MG PO TABS: 375.0000 mg | ORAL_TABLET | Freq: Two times a day (BID) | ORAL | 0 refills | Status: AC

## 2021-12-18 MED ORDER — CYCLOBENZAPRINE HCL 10 MG PO TABS: 10.0000 mg | ORAL_TABLET | Freq: Two times a day (BID) | ORAL | 0 refills | Status: AC | PRN

## 2021-12-18 NOTE — Discharge Instructions (Addendum)
Please refer to the attached instructions. Take medication as directed. 

## 2021-12-18 NOTE — ED Provider Notes (Signed)
[facility] EMERGENCY DEPARTMENT Provider Note   CSN: [id number] Arrival date & time: 12/18/21  2141     History  Chief Complaint  Patient presents with   Back Pain    Frederick Lyons is a 46 y.o. male.  Patient was riding a lawnmower yesterday, hit a hole, resulting in pain to the right lateral back and extending to mid axillary aspect of the right lower chest. Pain is worse with twisting of the torso. No dysuria. Ambulatory without difficulty.  The history is provided by the patient.  Back Pain Associated symptoms: no dysuria        Home Medications Prior to Admission medications   Medication Sig Start Date End Date Taking? Authorizing Provider  albuterol (PROVENTIL HFA;VENTOLIN HFA) 108 (90 Base) MCG/ACT inhaler Inhale 1-2 puffs into the lungs every 6 (six) hours as needed for wheezing or shortness of breath.    [provider]  aspirin EC 81 MG tablet Take 81 mg by mouth every 6 (six) hours as needed (for headache).    [provider]  ibuprofen (ADVIL,MOTRIN) 200 MG tablet Take 800 mg by mouth every 6 (six) hours as needed for headache, mild pain or moderate pain.    [provider]  predniSONE (DELTASONE) 20 MG tablet Take 3 tablets (60 mg total) by mouth daily. 09/15/18   Dione Booze, MD      Allergies    Patient has no known allergies.    Review of Systems   Review of Systems  Genitourinary:  Negative for dysuria.  Musculoskeletal:  Positive for back pain.  All other systems reviewed and are negative.   Physical Exam Updated Vital Signs BP 121/81 (BP Location: Right Arm)   Pulse 71   Temp 98.1 F (36.7 C) (Oral)   Resp 18   Ht 5\' 7"  (1.702 m)   Wt 83.9 kg   SpO2 97%   BMI 28.98 kg/m  Physical Exam Vitals and nursing note reviewed.  HENT:     Head: Normocephalic.     Nose: Nose normal.  Eyes:     Conjunctiva/sclera: Conjunctivae normal.  Cardiovascular:     Rate and Rhythm: Normal rate.  Pulmonary:      Effort: Pulmonary effort is normal.     Breath sounds: Normal breath sounds.  Abdominal:     Palpations: Abdomen is soft.  Musculoskeletal:        General: Normal range of motion.     Cervical back: Normal range of motion.     Comments: Pain/discomfort along right posterior chest wall radiating to lower chest wall mid-axillary. No bruising or swelling.  Skin:    General: Skin is warm and dry.  Neurological:     Mental Status: He is oriented to person, place, and time.  Psychiatric:        Mood and Affect: Mood normal.        Behavior: Behavior normal.     ED Results / Procedures / Treatments   Labs (all labs ordered are listed, but only abnormal results are displayed) Labs Reviewed - No data to display  EKG None  Radiology No results found.  Procedures Procedures    Medications Ordered in ED Medications - No data to display  ED Course/ Medical Decision Making/ A&P                           Medical Decision Making Risk Prescription drug management.   Patient  with right posterior and lateral chest wall pain after hitting a hole while riding his lawnmower. Pain appears to be musculoskeletal in nature. No shortness of breath. Pain is reproducible with movement. Low suspicion for ACS, pneumonia or other infectious process, Presentation not consistent with other acute, emergent causes of chest pain. No indication for cardiac enzyme testing.Care instructions provided and return precautions discussed. Discharged with NSAID and muscle relaxant.        Final Clinical Impression(s) / ED Diagnoses Final diagnoses:  Muscle strain    Rx / DC Orders ED Discharge Orders          Ordered    naproxen (NAPROSYN) 375 MG tablet  2 times daily        12/18/21 2300    cyclobenzaprine (FLEXERIL) 10 MG tablet  2 times daily PRN        12/18/21 2300              Felicie Morn, NP 12/18/21 2336    Charlynne Pander, MD 12/21/21 2302

## 2021-12-18 NOTE — ED Triage Notes (Signed)
Pt was on a lawn mower and hit a hole and now has lower RT back pain that radiates into RT flank. Denies urinary sx. 8/10 pain. Even and steady gait into triage.

## 2022-12-26 ENCOUNTER — Emergency Department (HOSPITAL_BASED_OUTPATIENT_CLINIC_OR_DEPARTMENT_OTHER)
Admission: EM | Admit: 2022-12-26 | Discharge: 2022-12-26 | Disposition: A | Discharge status: Home / Self Care | Payer: Non-veteran care | Attending: Emergency Medicine | Admitting: Emergency Medicine

## 2022-12-26 ENCOUNTER — Other Ambulatory Visit: Payer: Self-pay

## 2022-12-26 ENCOUNTER — Encounter (HOSPITAL_BASED_OUTPATIENT_CLINIC_OR_DEPARTMENT_OTHER): Payer: Self-pay

## 2022-12-26 DIAGNOSIS — J45909 Unspecified asthma, uncomplicated: Secondary | ICD-10-CM | POA: Insufficient documentation

## 2022-12-26 DIAGNOSIS — F419 Anxiety disorder, unspecified: Secondary | ICD-10-CM | POA: Insufficient documentation

## 2022-12-26 DIAGNOSIS — R450 Nervousness: Secondary | ICD-10-CM | POA: Insufficient documentation

## 2022-12-26 HISTORY — DX: Unspecified asthma, uncomplicated: J45.909

## 2022-12-26 NOTE — ED Provider Notes (Signed)
[facility]Y DEPT [facility] Emergency Department Provider Note MRN:  [id number]  Arrival date & time: 12/26/22     Chief Complaint   Jittery History of Present Illness   Frederick Lyons is a 47 y.o. year-old male with no pertinent past medical history presenting to the ED with chief complaint of jittery.  Patient explains he is currently taking prednisone for gout.  Took his medicine and also took 2 shots of alcohol.  Took a nap.  Woke up feeling jittery, generally unwell.  A bit anxious.  No chest pain or shortness of breath.  Feels better now.  Review of Systems  A thorough review of systems was obtained and all systems are negative except as noted in the HPI and PMH.   Patient's Health History    Past Medical History:  Diagnosis Date   Asthma    Hallux valgus (acquired)    RIGHT    Past Surgical History:  Procedure Laterality Date   BUNIONECTOMY Right 06/24/2013   Procedure: LAPIDUS BUNION;  Surgeon: Larey Dresser, DPM;  Location: [facility];  Service: Podiatry;  Laterality: Right;   METATARSAL OSTEOTOMY Right 06/24/2013   Procedure: AKIN-OSTEOTOMY;  Surgeon: Larey Dresser, DPM;  Location: [facility];  Service: Podiatry;  Laterality: Right;   ORIF  LEFT 5TH METACARPAL FX  05-29-2013    History reviewed. No pertinent family history.  Social History   Socioeconomic History   Marital status: Single    Spouse name: Not on file   Number of children: Not on file   Years of education: Not on file   Highest education level: Not on file  Occupational History   Not on file  Tobacco Use   Smoking status: Never   Smokeless tobacco: Never  Vaping Use   Vaping status: Never Used  Substance and Sexual Activity   Alcohol use: Yes    Comment: occ   Drug use: Yes    Types: Marijuana    Comment: occ   Sexual activity: Not on file  Other Topics Concern   Not on file  Social History Narrative   Not on file   Social  Determinants of Health   Financial Resource Strain: Not on File (10/01/2021)   Received from [employer]    Financial Resource Strain: 0  Food Insecurity: Not on file (12/13/2022)  Transportation Needs: Not on File (10/01/2021)   Received from [employer] [facility]eds    Transportation: 0  Recent Concern: Transportation Needs - At Risk (10/01/2021)   Received from [employer] [facility]eds    Transportation: 2  Physical Activity: Not on File (01/01/2019)   Received from [place], [place]   Physical Activity    Physical Activity: 0  Stress: Not on File (10/01/2021)   Received from [facility]   Stress    Stress: 0  Social Connections: Unknown (12/10/2021)   Received from [facility], [facility]   Social Network    Social Network: Not on file  Intimate Partner Violence: Unknown (12/10/2021)   Received from [employer], [facility]   HITS    Physically Hurt: Not on file    Insult or Talk Down To: Not on file    Threaten Physical Harm: Not on file    Scream or Curse: Not on file     Physical Exam   Vitals:   12/26/22 0200  BP: 132/74  Pulse: 99  Resp: 18  Temp: 98.6 F (  37 C)  SpO2: 100%    CONSTITUTIONAL: Well-appearing, NAD NEURO/PSYCH:  Alert and oriented x 3, no focal deficits EYES:  eyes equal and reactive ENT/NECK:  no LAD, no JVD CARDIO: Regular rate, well-perfused, normal S1 and S2 PULM:  CTAB no wheezing or rhonchi GI/GU:  non-distended, non-tender MSK/SPINE:  No gross deformities, no edema SKIN:  no rash, atraumatic   *Additional and/or pertinent findings included in MDM below  Diagnostic and Interventional Summary    EKG Interpretation Date/Time:  Thursday December 26 2022 03:29:04 EDT Ventricular Rate:  74 PR Interval:  180 QRS Duration:  95 QT Interval:  373 QTC Calculation: 414 R Axis:   95  Text Interpretation: Sinus rhythm No significant change was found Confirmed by Kennis Carina [phone]) on 12/26/2022 3:31:38 AM        Labs Reviewed - No data to display  No orders to display    Medications - No data to display   Procedures  /  Critical Care Procedures  ED Course and Medical Decision Making  Initial Impression and Ddx Minimal vague symptoms of jitteriness few hours ago, no symptoms at this time.  Suspect related to ingestion of prednisone with alcohol.  Vitals reassuring, no neurological deficits  Past medical/surgical history that increases complexity of ED encounter: History of gout  Interpretation of Diagnostics I personally reviewed the EKG and my interpretation is as follows: Screening EKG reveals no arrhythmia, no significant change from prior    Patient Reassessment and Ultimate Disposition/Management     Discharge  Patient management required discussion with the following services or consulting groups:  None  Complexity of Problems Addressed Acute complicated illness or Injury  Additional Data Reviewed and Analyzed Further history obtained from: None  Additional Factors Impacting ED Encounter Risk None  Elmer Sow. Pilar Plate, MD [facility] Emergency Medicine [facility] [email or web address]  Final Clinical Impressions(s) / ED Diagnoses     ICD-10-CM   1. Jittery feeling  R45.0       ED Discharge Orders     None        Discharge Instructions Discussed with and Provided to Patient:    Discharge Instructions      You were evaluated in the Emergency Department and after careful evaluation, we did not find any emergent condition requiring admission or further testing in the hospital.  Your exam/testing today was overall reassuring.  Symptoms likely due to prednisone side effect.   Please return to the Emergency Department if you experience any worsening of your condition.  Thank you for allowing [place] to be a part of your care.       Sabas Sous, MD 12/26/22 [phone]

## 2022-12-26 NOTE — ED Triage Notes (Signed)
Pt states he has hx of asthma and states he took a prednisone earlier today and has felt "jittery" "I just want to make sure everything is ok"

## 2022-12-26 NOTE — ED Notes (Signed)
ED Provider at bedside. 

## 2022-12-26 NOTE — Discharge Instructions (Addendum)
You were evaluated in the Emergency Department and after careful evaluation, we did not find any emergent condition requiring admission or further testing in the hospital.  Your exam/testing today was overall reassuring.  Symptoms likely due to prednisone side effect.   Please return to the Emergency Department if you experience any worsening of your condition.  Thank you for allowing [place] to be a part of your care.
